# Patient Record
Sex: Male | Born: 1965 | Race: White | Hispanic: No | Marital: Married | Smoking: Never smoker
Health system: Southern US, Community
[De-identification: ages and names within clinical notes are randomized; demographics above are authoritative.]

## PROBLEM LIST (undated history)

## (undated) DIAGNOSIS — C801 Malignant (primary) neoplasm, unspecified: Secondary | ICD-10-CM

## (undated) DIAGNOSIS — C8580 Other specified types of non-Hodgkin lymphoma, unspecified site: Secondary | ICD-10-CM

## (undated) HISTORY — PX: KNEE SURGERY: SHX244

## (undated) HISTORY — PX: SHOULDER ARTHROSCOPY: SHX128

## (undated) HISTORY — PX: OTHER SURGICAL HISTORY: SHX169

## (undated) HISTORY — DX: Other specified types of non-hodgkin lymphoma, unspecified site: C85.80

---

## 2007-07-04 ENCOUNTER — Ambulatory Visit: Payer: Self-pay | Admitting: Hematology and Oncology

## 2007-07-05 LAB — COMPREHENSIVE METABOLIC PANEL
ALT: 44 U/L (ref 0–53)
Albumin: 4.4 g/dL (ref 3.5–5.2)
Alkaline Phosphatase: 78 U/L (ref 39–117)
CO2: 28 mEq/L (ref 19–32)
Glucose, Bld: 91 mg/dL (ref 70–99)
Potassium: 4.3 mEq/L (ref 3.5–5.3)
Sodium: 142 mEq/L (ref 135–145)
Total Bilirubin: 0.5 mg/dL (ref 0.3–1.2)
Total Protein: 7 g/dL (ref 6.0–8.3)

## 2007-07-05 LAB — CBC WITH DIFFERENTIAL/PLATELET
BASO%: 0.9 % (ref 0.0–2.0)
EOS%: 2.9 % (ref 0.0–7.0)
Eosinophils Absolute: 0.1 10*3/uL (ref 0.0–0.5)
MCV: 91.5 fL (ref 81.6–98.0)
MONO%: 16.8 % — ABNORMAL HIGH (ref 0.0–13.0)
NEUT#: 3.2 10*3/uL (ref 1.5–6.5)
RBC: 4.33 10*6/uL (ref 4.20–5.71)
RDW: 12.8 % (ref 11.2–14.6)
WBC: 5 10*3/uL (ref 4.0–10.0)

## 2007-07-06 ENCOUNTER — Ambulatory Visit (HOSPITAL_COMMUNITY): Admission: RE | Admit: 2007-07-06 | Discharge: 2007-07-06 | Payer: Self-pay | Admitting: Hematology and Oncology

## 2007-07-11 ENCOUNTER — Ambulatory Visit (HOSPITAL_COMMUNITY): Admission: RE | Admit: 2007-07-11 | Discharge: 2007-07-11 | Payer: Self-pay | Admitting: Hematology and Oncology

## 2007-07-11 ENCOUNTER — Encounter (INDEPENDENT_AMBULATORY_CARE_PROVIDER_SITE_OTHER): Payer: Self-pay | Admitting: Interventional Radiology

## 2007-07-16 ENCOUNTER — Other Ambulatory Visit: Admission: RE | Admit: 2007-07-16 | Discharge: 2007-07-16 | Payer: Self-pay | Admitting: Hematology and Oncology

## 2007-07-16 ENCOUNTER — Encounter: Payer: Self-pay | Admitting: Hematology and Oncology

## 2007-07-17 ENCOUNTER — Ambulatory Visit (HOSPITAL_COMMUNITY): Admission: RE | Admit: 2007-07-17 | Discharge: 2007-07-17 | Payer: Self-pay | Admitting: Hematology and Oncology

## 2007-07-19 ENCOUNTER — Ambulatory Visit (HOSPITAL_COMMUNITY): Admission: RE | Admit: 2007-07-19 | Discharge: 2007-07-19 | Payer: Self-pay | Admitting: Hematology and Oncology

## 2007-07-20 LAB — CBC WITH DIFFERENTIAL/PLATELET
BASO%: 0 % (ref 0.0–2.0)
EOS%: 0 % (ref 0.0–7.0)
HCT: 33.2 % — ABNORMAL LOW (ref 38.7–49.9)
LYMPH%: 4.9 % — ABNORMAL LOW (ref 14.0–48.0)
MCH: 30.8 pg (ref 28.0–33.4)
MCHC: 34.8 g/dL (ref 32.0–35.9)
MONO%: 2.9 % (ref 0.0–13.0)
NEUT%: 92.2 % — ABNORMAL HIGH (ref 40.0–75.0)
Platelets: 141 10*3/uL — ABNORMAL LOW (ref 145–400)
RBC: 3.75 10*6/uL — ABNORMAL LOW (ref 4.20–5.71)

## 2007-07-20 LAB — COMPREHENSIVE METABOLIC PANEL
ALT: 33 U/L (ref 0–53)
AST: 21 U/L (ref 0–37)
Alkaline Phosphatase: 61 U/L (ref 39–117)
Creatinine, Ser: 1.06 mg/dL (ref 0.40–1.50)
Sodium: 138 mEq/L (ref 135–145)
Total Bilirubin: 0.4 mg/dL (ref 0.3–1.2)

## 2007-07-20 LAB — URIC ACID: Uric Acid, Serum: 6.9 mg/dL (ref 4.0–7.8)

## 2007-07-26 LAB — CBC WITH DIFFERENTIAL/PLATELET
Basophils Absolute: 0.1 10*3/uL (ref 0.0–0.1)
EOS%: 0.9 % (ref 0.0–7.0)
HCT: 36 % — ABNORMAL LOW (ref 38.7–49.9)
HGB: 12.6 g/dL — ABNORMAL LOW (ref 13.0–17.1)
MCH: 30.7 pg (ref 28.0–33.4)
MCHC: 35 g/dL (ref 32.0–35.9)
MCV: 87.7 fL (ref 81.6–98.0)
MONO%: 3.3 % (ref 0.0–13.0)
NEUT%: 80.3 % — ABNORMAL HIGH (ref 40.0–75.0)
RDW: 14.1 % (ref 11.2–14.6)

## 2007-07-26 LAB — BASIC METABOLIC PANEL
BUN: 16 mg/dL (ref 6–23)
Creatinine, Ser: 0.8 mg/dL (ref 0.40–1.50)

## 2007-08-07 LAB — COMPREHENSIVE METABOLIC PANEL
Albumin: 4.4 g/dL (ref 3.5–5.2)
Alkaline Phosphatase: 76 U/L (ref 39–117)
CO2: 26 mEq/L (ref 19–32)
Glucose, Bld: 101 mg/dL — ABNORMAL HIGH (ref 70–99)
Potassium: 4 mEq/L (ref 3.5–5.3)
Sodium: 139 mEq/L (ref 135–145)
Total Protein: 6.3 g/dL (ref 6.0–8.3)

## 2007-08-07 LAB — CBC WITH DIFFERENTIAL/PLATELET
Eosinophils Absolute: 0.1 10*3/uL (ref 0.0–0.5)
MONO#: 0.6 10*3/uL (ref 0.1–0.9)
NEUT#: 3.4 10*3/uL (ref 1.5–6.5)
RBC: 4.17 10*6/uL — ABNORMAL LOW (ref 4.20–5.71)
RDW: 14.4 % (ref 11.2–14.6)
WBC: 4.6 10*3/uL (ref 4.0–10.0)
lymph#: 0.5 10*3/uL — ABNORMAL LOW (ref 0.9–3.3)

## 2007-08-08 ENCOUNTER — Ambulatory Visit: Payer: Self-pay | Admitting: Hematology and Oncology

## 2007-08-30 LAB — COMPREHENSIVE METABOLIC PANEL
CO2: 25 mEq/L (ref 19–32)
Creatinine, Ser: 0.9 mg/dL (ref 0.40–1.50)
Glucose, Bld: 113 mg/dL — ABNORMAL HIGH (ref 70–99)
Total Bilirubin: 0.5 mg/dL (ref 0.3–1.2)

## 2007-08-30 LAB — CBC WITH DIFFERENTIAL/PLATELET
Eosinophils Absolute: 0.1 10*3/uL (ref 0.0–0.5)
HCT: 35.6 % — ABNORMAL LOW (ref 38.7–49.9)
LYMPH%: 9.2 % — ABNORMAL LOW (ref 14.0–48.0)
MCHC: 35.2 g/dL (ref 32.0–35.9)
MCV: 92.7 fL (ref 81.6–98.0)
MONO#: 0.7 10*3/uL (ref 0.1–0.9)
MONO%: 12.2 % (ref 0.0–13.0)
NEUT#: 4.1 10*3/uL (ref 1.5–6.5)
NEUT%: 76 % — ABNORMAL HIGH (ref 40.0–75.0)
Platelets: 247 10*3/uL (ref 145–400)
WBC: 5.3 10*3/uL (ref 4.0–10.0)

## 2007-09-20 LAB — COMPREHENSIVE METABOLIC PANEL
ALT: 34 U/L (ref 0–53)
AST: 22 U/L (ref 0–37)
Albumin: 4.3 g/dL (ref 3.5–5.2)
Alkaline Phosphatase: 72 U/L (ref 39–117)
BUN: 14 mg/dL (ref 6–23)
Potassium: 4.2 mEq/L (ref 3.5–5.3)

## 2007-09-20 LAB — CBC WITH DIFFERENTIAL/PLATELET
Basophils Absolute: 0 10*3/uL (ref 0.0–0.1)
Eosinophils Absolute: 0.1 10*3/uL (ref 0.0–0.5)
HCT: 34.1 % — ABNORMAL LOW (ref 38.7–49.9)
HGB: 12 g/dL — ABNORMAL LOW (ref 13.0–17.1)
LYMPH%: 13.6 % — ABNORMAL LOW (ref 14.0–48.0)
MONO#: 0.9 10*3/uL (ref 0.1–0.9)
NEUT#: 3.6 10*3/uL (ref 1.5–6.5)
NEUT%: 67.9 % (ref 40.0–75.0)
Platelets: 292 10*3/uL (ref 145–400)
WBC: 5.4 10*3/uL (ref 4.0–10.0)

## 2007-09-26 ENCOUNTER — Ambulatory Visit (HOSPITAL_COMMUNITY): Admission: RE | Admit: 2007-09-26 | Discharge: 2007-09-26 | Payer: Self-pay | Admitting: Hematology and Oncology

## 2007-10-09 ENCOUNTER — Ambulatory Visit: Payer: Self-pay | Admitting: Hematology and Oncology

## 2007-10-11 LAB — CBC WITH DIFFERENTIAL/PLATELET
BASO%: 0.2 % (ref 0.0–2.0)
EOS%: 1.2 % (ref 0.0–7.0)
HCT: 33.3 % — ABNORMAL LOW (ref 38.7–49.9)
LYMPH%: 13.7 % — ABNORMAL LOW (ref 14.0–48.0)
MCH: 33.4 pg (ref 28.0–33.4)
MCHC: 35.6 g/dL (ref 32.0–35.9)
MCV: 93.8 fL (ref 81.6–98.0)
MONO#: 0.8 10*3/uL (ref 0.1–0.9)
MONO%: 18.6 % — ABNORMAL HIGH (ref 0.0–13.0)
NEUT%: 66.3 % (ref 40.0–75.0)
Platelets: 270 10*3/uL (ref 145–400)
RBC: 3.55 10*6/uL — ABNORMAL LOW (ref 4.20–5.71)

## 2007-10-11 LAB — COMPREHENSIVE METABOLIC PANEL
ALT: 24 U/L (ref 0–53)
Alkaline Phosphatase: 62 U/L (ref 39–117)
CO2: 26 mEq/L (ref 19–32)
Creatinine, Ser: 0.91 mg/dL (ref 0.40–1.50)
Total Bilirubin: 0.5 mg/dL (ref 0.3–1.2)

## 2007-11-01 LAB — CBC WITH DIFFERENTIAL/PLATELET
BASO%: 0.3 % (ref 0.0–2.0)
EOS%: 0.9 % (ref 0.0–7.0)
Eosinophils Absolute: 0 10*3/uL (ref 0.0–0.5)
MCHC: 34.9 g/dL (ref 32.0–35.9)
MCV: 95.6 fL (ref 81.6–98.0)
MONO%: 18.6 % — ABNORMAL HIGH (ref 0.0–13.0)
NEUT#: 2.6 10*3/uL (ref 1.5–6.5)
RBC: 3.68 10*6/uL — ABNORMAL LOW (ref 4.20–5.71)
RDW: 14.5 % (ref 11.2–14.6)

## 2007-11-01 LAB — COMPREHENSIVE METABOLIC PANEL WITH GFR
ALT: 29 U/L (ref 0–53)
AST: 23 U/L (ref 0–37)
Albumin: 4.5 g/dL (ref 3.5–5.2)
Alkaline Phosphatase: 60 U/L (ref 39–117)
BUN: 14 mg/dL (ref 6–23)
CO2: 24 meq/L (ref 19–32)
Calcium: 9.6 mg/dL (ref 8.4–10.5)
Chloride: 104 meq/L (ref 96–112)
Creatinine, Ser: 0.94 mg/dL (ref 0.40–1.50)
Glucose, Bld: 84 mg/dL (ref 70–99)
Potassium: 4.4 meq/L (ref 3.5–5.3)
Sodium: 138 meq/L (ref 135–145)
Total Bilirubin: 0.5 mg/dL (ref 0.3–1.2)
Total Protein: 6.3 g/dL (ref 6.0–8.3)

## 2007-11-22 LAB — COMPREHENSIVE METABOLIC PANEL
AST: 18 U/L (ref 0–37)
Albumin: 4.6 g/dL (ref 3.5–5.2)
Alkaline Phosphatase: 67 U/L (ref 39–117)
BUN: 18 mg/dL (ref 6–23)
Potassium: 4.3 mEq/L (ref 3.5–5.3)
Sodium: 141 mEq/L (ref 135–145)
Total Bilirubin: 0.5 mg/dL (ref 0.3–1.2)
Total Protein: 6.4 g/dL (ref 6.0–8.3)

## 2007-11-22 LAB — CBC WITH DIFFERENTIAL/PLATELET
EOS%: 0.6 % (ref 0.0–7.0)
LYMPH%: 8.4 % — ABNORMAL LOW (ref 14.0–48.0)
MCH: 33.7 pg — ABNORMAL HIGH (ref 28.0–33.4)
MCHC: 35.3 g/dL (ref 32.0–35.9)
MCV: 95.5 fL (ref 81.6–98.0)
MONO%: 12.5 % (ref 0.0–13.0)
Platelets: 271 10*3/uL (ref 145–400)
RBC: 3.69 10*6/uL — ABNORMAL LOW (ref 4.20–5.71)
RDW: 14.3 % (ref 11.2–14.6)

## 2007-11-23 ENCOUNTER — Ambulatory Visit: Payer: Self-pay | Admitting: Hematology and Oncology

## 2007-12-12 LAB — CBC WITH DIFFERENTIAL/PLATELET
Eosinophils Absolute: 0 10*3/uL (ref 0.0–0.5)
MONO#: 0.7 10*3/uL (ref 0.1–0.9)
MONO%: 17.4 % — ABNORMAL HIGH (ref 0.0–13.0)
NEUT#: 2.7 10*3/uL (ref 1.5–6.5)
RBC: 3.78 10*6/uL — ABNORMAL LOW (ref 4.20–5.71)
RDW: 14.4 % (ref 11.2–14.6)
WBC: 3.8 10*3/uL — ABNORMAL LOW (ref 4.0–10.0)

## 2007-12-12 LAB — COMPREHENSIVE METABOLIC PANEL
ALT: 21 U/L (ref 0–53)
Albumin: 4.4 g/dL (ref 3.5–5.2)
Alkaline Phosphatase: 61 U/L (ref 39–117)
CO2: 27 mEq/L (ref 19–32)
Glucose, Bld: 85 mg/dL (ref 70–99)
Potassium: 4.2 mEq/L (ref 3.5–5.3)
Sodium: 141 mEq/L (ref 135–145)
Total Protein: 6.2 g/dL (ref 6.0–8.3)

## 2008-01-01 ENCOUNTER — Ambulatory Visit (HOSPITAL_COMMUNITY): Admission: RE | Admit: 2008-01-01 | Discharge: 2008-01-01 | Payer: Self-pay | Admitting: Hematology and Oncology

## 2008-01-04 LAB — CBC WITH DIFFERENTIAL/PLATELET
Basophils Absolute: 0 10*3/uL (ref 0.0–0.1)
Eosinophils Absolute: 0.1 10*3/uL (ref 0.0–0.5)
HCT: 38.7 % (ref 38.7–49.9)
MCV: 95 fL (ref 81.6–98.0)
MONO%: 17 % — ABNORMAL HIGH (ref 0.0–13.0)
RDW: 14.5 % (ref 11.2–14.6)
WBC: 4.7 10*3/uL (ref 4.0–10.0)
lymph#: 0.6 10*3/uL — ABNORMAL LOW (ref 0.9–3.3)

## 2008-01-04 LAB — COMPREHENSIVE METABOLIC PANEL
Albumin: 4.7 g/dL (ref 3.5–5.2)
BUN: 15 mg/dL (ref 6–23)
Calcium: 9.4 mg/dL (ref 8.4–10.5)
Chloride: 105 mEq/L (ref 96–112)
Glucose, Bld: 85 mg/dL (ref 70–99)
Potassium: 4.3 mEq/L (ref 3.5–5.3)

## 2008-01-04 LAB — LACTATE DEHYDROGENASE: LDH: 181 U/L (ref 94–250)

## 2008-02-20 ENCOUNTER — Ambulatory Visit: Payer: Self-pay | Admitting: Hematology and Oncology

## 2008-02-22 ENCOUNTER — Ambulatory Visit (HOSPITAL_COMMUNITY): Admission: RE | Admit: 2008-02-22 | Discharge: 2008-02-22 | Payer: Self-pay | Admitting: Hematology and Oncology

## 2008-02-22 LAB — CBC WITH DIFFERENTIAL/PLATELET
BASO%: 0.3 % (ref 0.0–2.0)
Eosinophils Absolute: 0.1 10*3/uL (ref 0.0–0.5)
MCHC: 35.1 g/dL (ref 32.0–35.9)
MONO#: 0.5 10*3/uL (ref 0.1–0.9)
NEUT#: 2.5 10*3/uL (ref 1.5–6.5)
RBC: 4.34 10*6/uL (ref 4.20–5.71)
RDW: 12.7 % (ref 11.2–14.6)
WBC: 4.2 10*3/uL (ref 4.0–10.0)

## 2008-02-22 LAB — COMPREHENSIVE METABOLIC PANEL
ALT: 29 U/L (ref 0–53)
Albumin: 4.6 g/dL (ref 3.5–5.2)
Alkaline Phosphatase: 49 U/L (ref 39–117)
CO2: 30 mEq/L (ref 19–32)
Glucose, Bld: 93 mg/dL (ref 70–99)
Potassium: 4.4 mEq/L (ref 3.5–5.3)
Sodium: 138 mEq/L (ref 135–145)
Total Protein: 6.6 g/dL (ref 6.0–8.3)

## 2008-02-22 LAB — LACTATE DEHYDROGENASE: LDH: 153 U/L (ref 94–250)

## 2008-03-25 LAB — COMPREHENSIVE METABOLIC PANEL
Alkaline Phosphatase: 52 U/L (ref 39–117)
BUN: 10 mg/dL (ref 6–23)
Glucose, Bld: 74 mg/dL (ref 70–99)
Total Bilirubin: 0.8 mg/dL (ref 0.3–1.2)

## 2008-03-25 LAB — CBC WITH DIFFERENTIAL/PLATELET
Basophils Absolute: 0 10*3/uL (ref 0.0–0.1)
Eosinophils Absolute: 0.1 10*3/uL (ref 0.0–0.5)
HGB: 14.8 g/dL (ref 13.0–17.1)
LYMPH%: 16.5 % (ref 14.0–49.0)
MCV: 93.2 fL (ref 79.3–98.0)
MONO%: 10.7 % (ref 0.0–14.0)
NEUT#: 3.6 10*3/uL (ref 1.5–6.5)
Platelets: 179 10*3/uL (ref 140–400)
RBC: 4.56 10*6/uL (ref 4.20–5.82)

## 2008-04-01 ENCOUNTER — Ambulatory Visit: Payer: Self-pay | Admitting: Hematology and Oncology

## 2008-04-01 ENCOUNTER — Inpatient Hospital Stay (HOSPITAL_COMMUNITY): Admission: AD | Admit: 2008-04-01 | Discharge: 2008-04-03 | Payer: Self-pay | Admitting: Hematology and Oncology

## 2008-04-02 ENCOUNTER — Ambulatory Visit: Payer: Self-pay | Admitting: Hematology and Oncology

## 2008-04-15 LAB — CBC WITH DIFFERENTIAL/PLATELET
Basophils Absolute: 0 10*3/uL (ref 0.0–0.1)
Eosinophils Absolute: 0 10*3/uL (ref 0.0–0.5)
HCT: 37.5 % — ABNORMAL LOW (ref 38.4–49.9)
HGB: 12.8 g/dL — ABNORMAL LOW (ref 13.0–17.1)
LYMPH%: 13.9 % — ABNORMAL LOW (ref 14.0–49.0)
MCV: 90.6 fL (ref 79.3–98.0)
MONO%: 15.1 % — ABNORMAL HIGH (ref 0.0–14.0)
NEUT#: 3.6 10*3/uL (ref 1.5–6.5)
Platelets: 66 10*3/uL — ABNORMAL LOW (ref 140–400)

## 2008-04-15 LAB — COMPREHENSIVE METABOLIC PANEL
CO2: 28 mEq/L (ref 19–32)
Creatinine, Ser: 0.81 mg/dL (ref 0.40–1.50)
Glucose, Bld: 107 mg/dL — ABNORMAL HIGH (ref 70–99)
Sodium: 137 mEq/L (ref 135–145)
Total Bilirubin: 0.5 mg/dL (ref 0.3–1.2)
Total Protein: 6.5 g/dL (ref 6.0–8.3)

## 2008-04-21 LAB — CBC WITH DIFFERENTIAL/PLATELET
Eosinophils Absolute: 0 10*3/uL (ref 0.0–0.5)
LYMPH%: 15.3 % (ref 14.0–49.0)
MONO#: 0.3 10*3/uL (ref 0.1–0.9)
NEUT#: 3.3 10*3/uL (ref 1.5–6.5)
Platelets: 216 10*3/uL (ref 140–400)
RBC: 3.92 10*6/uL — ABNORMAL LOW (ref 4.20–5.82)
WBC: 4.3 10*3/uL (ref 4.0–10.3)
lymph#: 0.7 10*3/uL — ABNORMAL LOW (ref 0.9–3.3)

## 2008-04-21 LAB — COMPREHENSIVE METABOLIC PANEL
Albumin: 4.6 g/dL (ref 3.5–5.2)
CO2: 27 mEq/L (ref 19–32)
Calcium: 9.1 mg/dL (ref 8.4–10.5)
Chloride: 107 mEq/L (ref 96–112)
Glucose, Bld: 143 mg/dL — ABNORMAL HIGH (ref 70–99)
Potassium: 4.4 mEq/L (ref 3.5–5.3)
Sodium: 141 mEq/L (ref 135–145)
Total Protein: 6.6 g/dL (ref 6.0–8.3)

## 2008-04-22 ENCOUNTER — Inpatient Hospital Stay (HOSPITAL_COMMUNITY): Admission: AD | Admit: 2008-04-22 | Discharge: 2008-04-24 | Payer: Self-pay | Admitting: Hematology and Oncology

## 2008-05-02 LAB — CBC WITH DIFFERENTIAL/PLATELET
Basophils Absolute: 0 10*3/uL (ref 0.0–0.1)
EOS%: 0.5 % (ref 0.0–7.0)
Eosinophils Absolute: 0 10*3/uL (ref 0.0–0.5)
HGB: 11.6 g/dL — ABNORMAL LOW (ref 13.0–17.1)
LYMPH%: 7.6 % — ABNORMAL LOW (ref 14.0–49.0)
MCH: 32.6 pg (ref 27.2–33.4)
MCV: 95.3 fL (ref 79.3–98.0)
MONO%: 16.7 % — ABNORMAL HIGH (ref 0.0–14.0)
NEUT#: 4.5 10*3/uL (ref 1.5–6.5)
NEUT%: 75.1 % — ABNORMAL HIGH (ref 39.0–75.0)
Platelets: 152 10*3/uL (ref 140–400)

## 2008-05-19 ENCOUNTER — Ambulatory Visit: Payer: Self-pay | Admitting: Hematology and Oncology

## 2008-05-19 LAB — CBC WITH DIFFERENTIAL/PLATELET
BASO%: 0.3 % (ref 0.0–2.0)
EOS%: 1.8 % (ref 0.0–7.0)
Eosinophils Absolute: 0.1 10*3/uL (ref 0.0–0.5)
LYMPH%: 14.7 % (ref 14.0–49.0)
MCH: 33.4 pg (ref 27.2–33.4)
MCHC: 34.6 g/dL (ref 32.0–36.0)
MCV: 96.6 fL (ref 79.3–98.0)
MONO%: 19.7 % — ABNORMAL HIGH (ref 0.0–14.0)
Platelets: 246 10*3/uL (ref 140–400)
RBC: 3.81 10*6/uL — ABNORMAL LOW (ref 4.20–5.82)

## 2008-05-19 LAB — COMPREHENSIVE METABOLIC PANEL
AST: 26 U/L (ref 0–37)
Alkaline Phosphatase: 58 U/L (ref 39–117)
Glucose, Bld: 96 mg/dL (ref 70–99)
Sodium: 140 mEq/L (ref 135–145)
Total Bilirubin: 0.5 mg/dL (ref 0.3–1.2)
Total Protein: 6.4 g/dL (ref 6.0–8.3)

## 2008-05-20 ENCOUNTER — Ambulatory Visit: Payer: Self-pay | Admitting: Hematology and Oncology

## 2008-05-20 ENCOUNTER — Inpatient Hospital Stay (HOSPITAL_COMMUNITY): Admission: AD | Admit: 2008-05-20 | Discharge: 2008-05-22 | Payer: Self-pay | Admitting: Hematology and Oncology

## 2008-05-28 LAB — CBC WITH DIFFERENTIAL/PLATELET
BASO%: 0.2 % (ref 0.0–2.0)
Eosinophils Absolute: 0.1 10*3/uL (ref 0.0–0.5)
HCT: 34.4 % — ABNORMAL LOW (ref 38.4–49.9)
HGB: 11.9 g/dL — ABNORMAL LOW (ref 13.0–17.1)
LYMPH%: 4.1 % — ABNORMAL LOW (ref 14.0–49.0)
MCHC: 34.5 g/dL (ref 32.0–36.0)
MONO#: 0.5 10*3/uL (ref 0.1–0.9)
NEUT#: 9.7 10*3/uL — ABNORMAL HIGH (ref 1.5–6.5)
NEUT%: 89.8 % — ABNORMAL HIGH (ref 39.0–75.0)
Platelets: 98 10*3/uL — ABNORMAL LOW (ref 140–400)
WBC: 10.8 10*3/uL — ABNORMAL HIGH (ref 4.0–10.3)
lymph#: 0.4 10*3/uL — ABNORMAL LOW (ref 0.9–3.3)

## 2008-05-28 LAB — TECHNOLOGIST REVIEW

## 2008-05-30 LAB — CBC WITH DIFFERENTIAL/PLATELET
Basophils Absolute: 0.1 10*3/uL (ref 0.0–0.1)
Eosinophils Absolute: 0.1 10*3/uL (ref 0.0–0.5)
HCT: 35.3 % — ABNORMAL LOW (ref 38.4–49.9)
HGB: 12.4 g/dL — ABNORMAL LOW (ref 13.0–17.1)
MCH: 34 pg — ABNORMAL HIGH (ref 27.2–33.4)
MCV: 97.1 fL (ref 79.3–98.0)
NEUT#: 3.9 10*3/uL (ref 1.5–6.5)
NEUT%: 78.4 % — ABNORMAL HIGH (ref 39.0–75.0)
RDW: 16.6 % — ABNORMAL HIGH (ref 11.0–14.6)
lymph#: 0.5 10*3/uL — ABNORMAL LOW (ref 0.9–3.3)

## 2008-06-06 ENCOUNTER — Other Ambulatory Visit: Admission: RE | Admit: 2008-06-06 | Discharge: 2008-06-06 | Payer: Self-pay | Admitting: Oncology

## 2008-06-06 ENCOUNTER — Encounter: Payer: Self-pay | Admitting: Oncology

## 2008-07-04 ENCOUNTER — Ambulatory Visit: Payer: Self-pay | Admitting: Hematology and Oncology

## 2008-08-13 ENCOUNTER — Ambulatory Visit: Payer: Self-pay | Admitting: Hematology and Oncology

## 2009-08-11 IMAGING — CT CT CHEST W/ CM
3 of 6 series · 15 of 36 positions shown, 18 images · IV contrast (agent unspecified)
Comparison: None

CT NECK

CLINICAL DATA: Non-Hodgkins lymphoma

CT NECK AND CHEST WITH CONTRAST
TECHNIQUE: Multidetector CT imaging of the neck and chest was
performed using the standard protocol after bolus administration of
intravenous contrast.
Contrast: 100 ml Cmnipaque-3XX

[Series 2: chest routine 5.0 b40f · axial · 0.73mm/px · z∈[-518,-218]mm · 11 of 74 slices shown, 14 images]
[im 7/74  mediastinal]
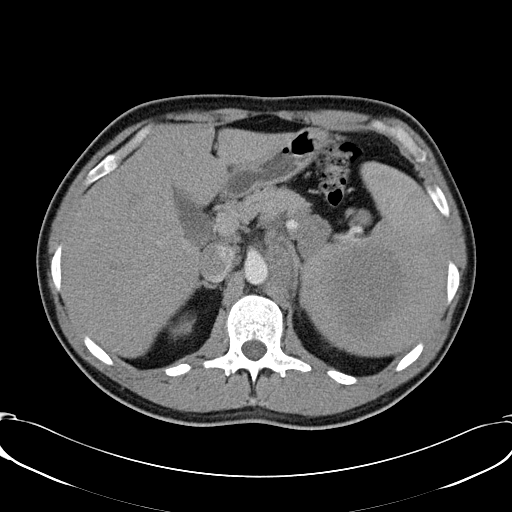
[im 7/74  lung]
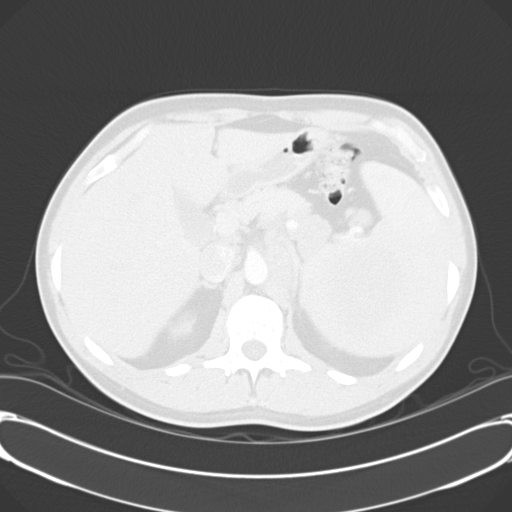
[im 13/74  lung]
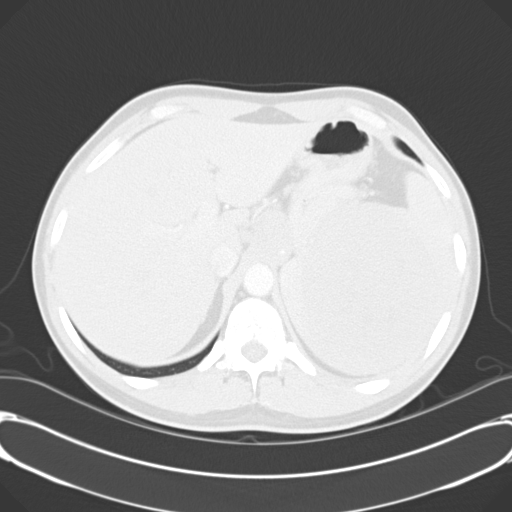
[im 19/74  lung]
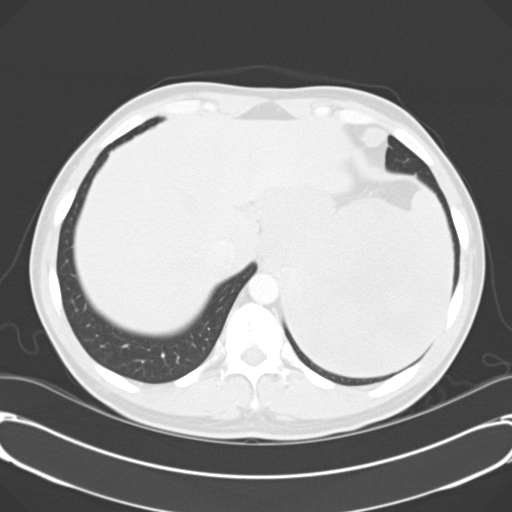
[im 25/74  lung]
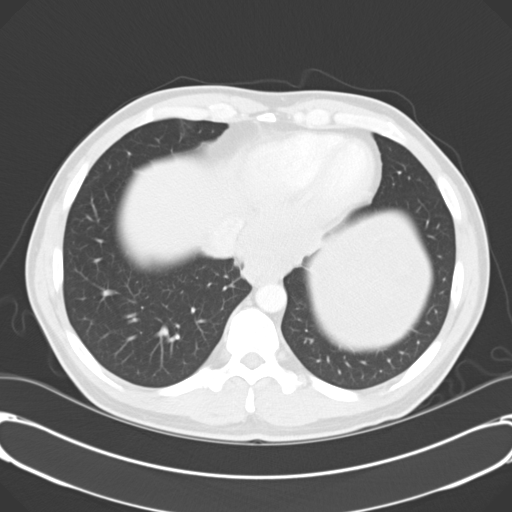
[im 31/74  mediastinal]
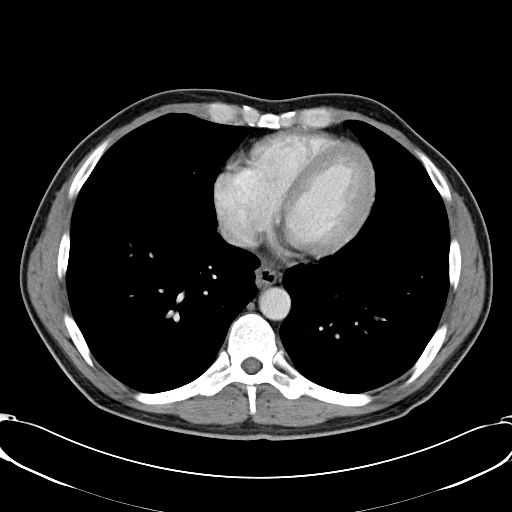
[im 31/74  lung]
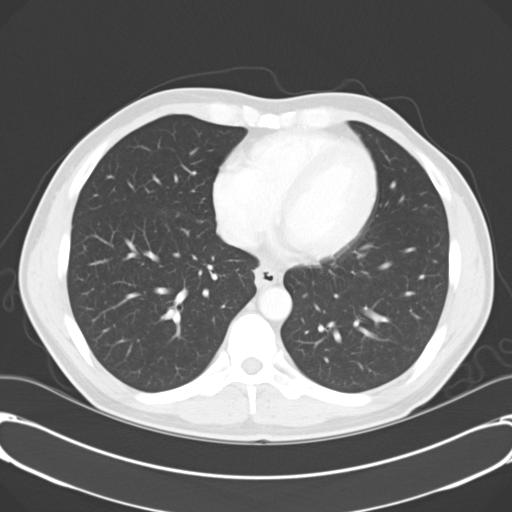
[im 37/74  lung]
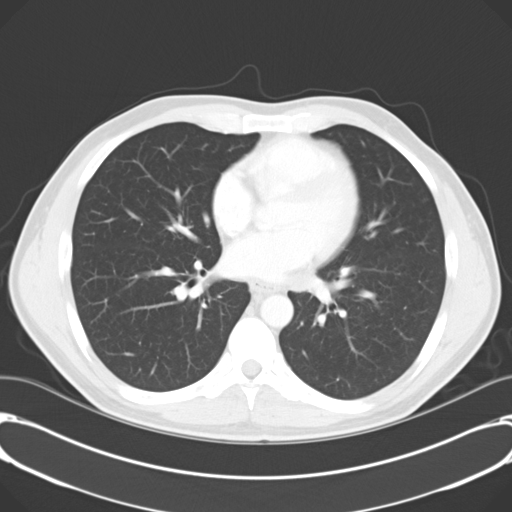
[im 43/74  lung]
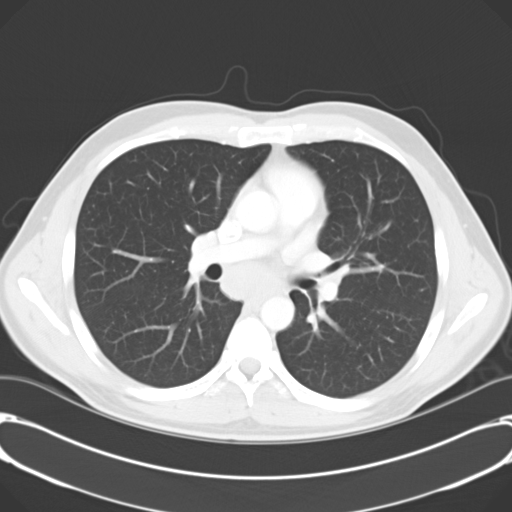
[im 49/74  lung]
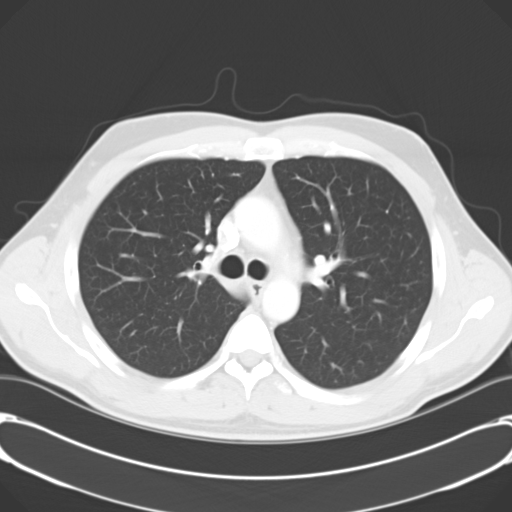
[im 55/74  mediastinal]
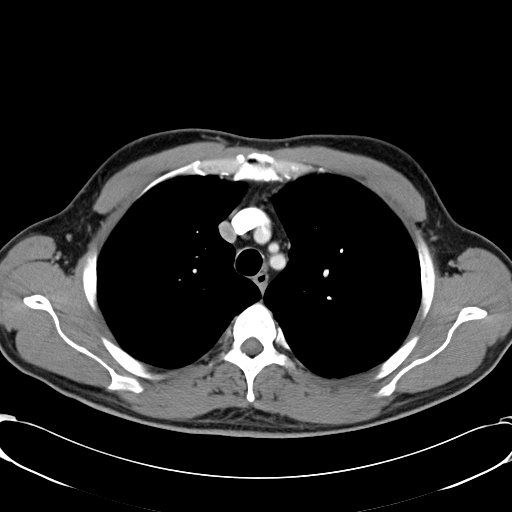
[im 55/74  lung]
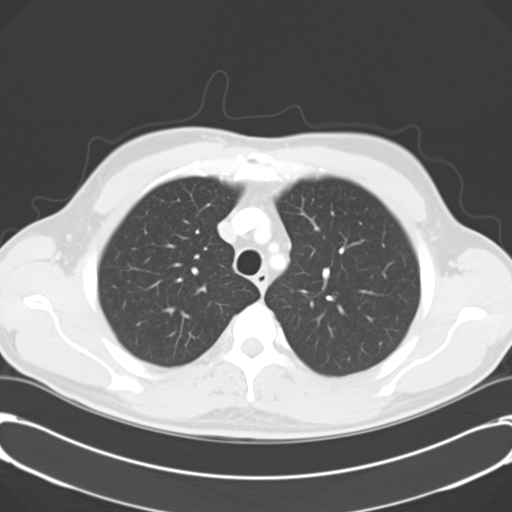
[im 61/74  lung]
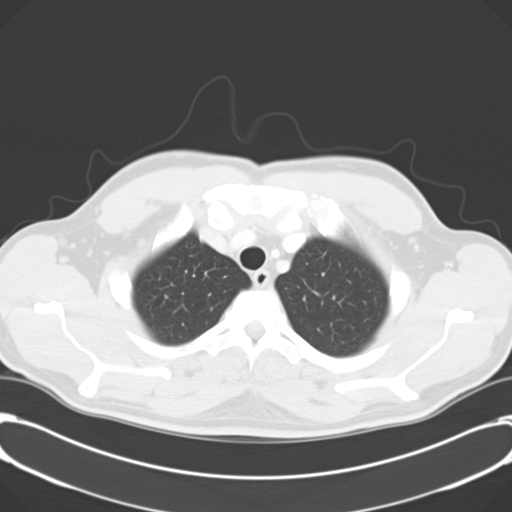
[im 67/74  lung]
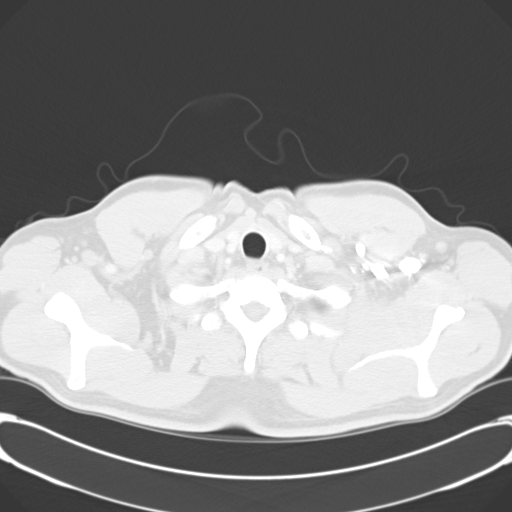

[Series 5: neck 3.0 b40s · axial · 0.46mm/px · z∈[-241,-169]mm · 3 of 84 slices shown]
[im 6/84  lung]
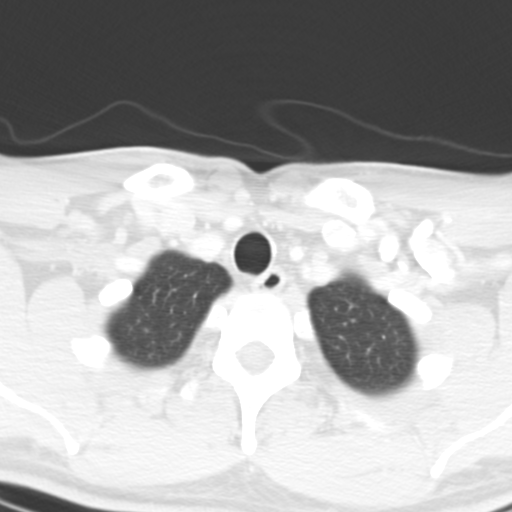
[im 18/84  lung]
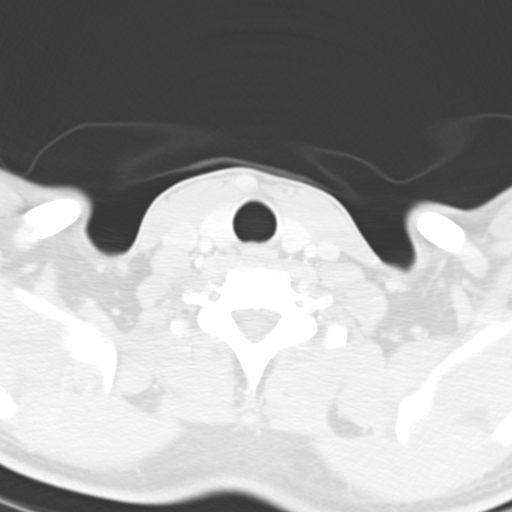
[im 30/84  lung]
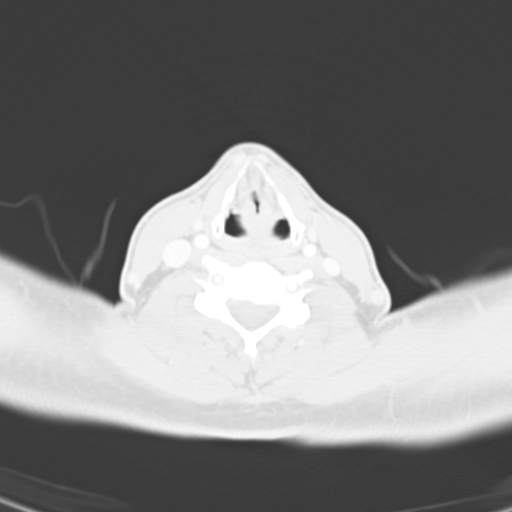

[Series 602: <mpr thick range> · coronal · 0.73mm/px · 1 of 75 slices shown]
[im 38/75  lung]
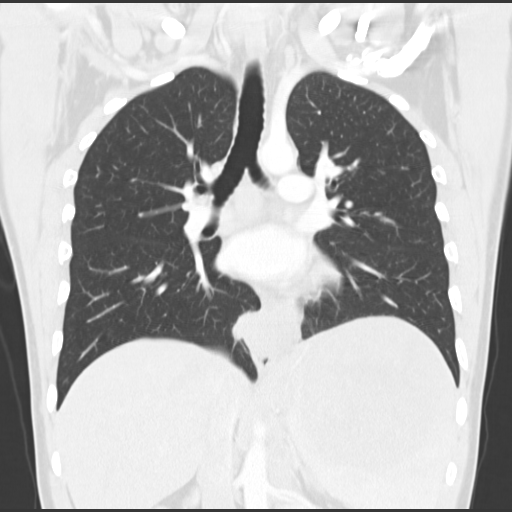

[15 of 36 positions shown; findings below may reference images not displayed]

FINDINGS: The visualized brain is unremarkable.  No intracranial
lesions and normal appearance of the major visualized vascular
structures.

The skull base, floor of the mouth and tongue base are
unremarkable.  The epiglottis, aryepiglottic folds and periglottic
fat planes are normal.  No cervical adenopathy.  The parotid and
submandibular glands appear normal.  The major vascular structures
are unremarkable.  No supraclavicular adenopathy.  The thyroid
gland appears normal.
IMPRESSION: 1.  Unremarkable CT examination of the neck.  No adenopathy.

CT CHEST
FINDINGS: The chest wall is unremarkable.  No supraclavicular or
axillary adenopathy.  There are small scattered lymph nodes.  There
is a subcarinal mass which measures 4.5 x 2.9 cm.  This is likely
adenopathy no prevascular or pretracheal adenopathy.  There is a
small lymph node in the supra mediastinum adjacent to the left
carotid artery.  No hilar adenopathy.  Esophagus is unremarkable.
There is a 2.0 x 1.4 cm node just inferior to the left inferior
pulmonary vein.  The largest mass is between the left atrium and
the esophagus near the diaphragm which may extend through the
diaphragmatic hiatus.  This measures approximately 8.5 x 5.8 cm.
Enlarged epicardial lymph nodes are noted on the left side also.

There is a large mass in the spleen measuring 12 x 10 cm.
Extensive adenopathy in the upper abdomen both in the mesentery and
retroperitoneal.

Examination of the lung parenchyma demonstrates no significant
findings.  No significant bony findings.
IMPRESSION: 1.  Extensive adenopathy in the chest and abdomen.
2.  Large splenic lesion.

## 2010-02-14 ENCOUNTER — Encounter: Payer: Self-pay | Admitting: Hematology and Oncology

## 2010-05-05 LAB — URINALYSIS, ROUTINE W REFLEX MICROSCOPIC
Bilirubin Urine: NEGATIVE
Glucose, UA: 250 mg/dL — AB
Glucose, UA: NEGATIVE mg/dL
Hgb urine dipstick: NEGATIVE
Hgb urine dipstick: NEGATIVE
Ketones, ur: >80 mg/dL — AB
Ketones, ur: NEGATIVE mg/dL
Protein, ur: NEGATIVE mg/dL
Protein, ur: NEGATIVE mg/dL
Urobilinogen, UA: 0.2 mg/dL (ref 0.0–1.0)
pH: 5.5 (ref 5.0–8.0)

## 2010-05-05 LAB — URINALYSIS, DIPSTICK ONLY
Bilirubin Urine: NEGATIVE
Glucose, UA: NEGATIVE mg/dL
Hgb urine dipstick: NEGATIVE
Ketones, ur: >80 mg/dL — AB
Protein, ur: NEGATIVE mg/dL

## 2010-05-06 LAB — COMPREHENSIVE METABOLIC PANEL
AST: 13 U/L (ref 0–37)
CO2: 24 mEq/L (ref 19–32)
Chloride: 109 mEq/L (ref 96–112)
Creatinine, Ser: 0.8 mg/dL (ref 0.4–1.5)
GFR calc Af Amer: >60 mL/min (ref >60)
GFR calc non Af Amer: >60 mL/min (ref >60)
Total Bilirubin: 0.9 mg/dL (ref 0.3–1.2)

## 2010-05-06 LAB — DIFFERENTIAL
Basophils Absolute: 0.1 10*3/uL (ref 0.0–0.1)
Basophils Relative: 1 % (ref 0–1)
Eosinophils Absolute: 0 10*3/uL (ref 0.0–0.7)
Eosinophils Relative: 0 % (ref 0–5)
Lymphocytes Relative: 5 % — ABNORMAL LOW (ref 12–46)

## 2010-05-06 LAB — URINALYSIS, DIPSTICK ONLY
Glucose, UA: 250 mg/dL — AB
Hgb urine dipstick: NEGATIVE
Specific Gravity, Urine: 1.017 (ref 1.005–1.030)
Urobilinogen, UA: 0.2 mg/dL (ref 0.0–1.0)
pH: 7 (ref 5.0–8.0)

## 2010-05-06 LAB — CBC
HCT: 39.5 % (ref 39.0–52.0)
MCV: 93.8 fL (ref 78.0–100.0)
RBC: 4.21 MIL/uL — ABNORMAL LOW (ref 4.22–5.81)
WBC: 12.4 10*3/uL — ABNORMAL HIGH (ref 4.0–10.5)

## 2010-05-10 LAB — GLUCOSE, CAPILLARY: Glucose-Capillary: 84 mg/dL (ref 70–99)

## 2010-06-08 NOTE — Discharge Summary (Signed)
NAMEGREGREY, BLOYD              ACCOUNT NO.:  [id number]   MEDICAL RECORD NO.:  [id number]          PATIENT TYPE:  INP   LOCATION:  1341                         FACILITY:  [facility]   PHYSICIAN:  Lauretta I. Odogwu, M.D.DATE OF BIRTH:  06-30-1965   DATE OF ADMISSION:  04/22/2008  DATE OF DISCHARGE:                               DISCHARGE SUMMARY   DISCHARGE DIAGNOSIS:  Large B cell non-Hodgkin's lymphoma, status post  cycle 2 of RICE chemotherapy prior to autologous transplant.   DISCHARGE PHYSICAL EXAMINATION:  VITAL SIGNS:  Pulse 68, blood pressure  115/64, temperature 97.8, respirations 18.  Saturation 98%.  Physical exam - Unremarkable.   DISCHARGE MEDICATIONS:  1. Zofran 8 mg p.o. q.12h. x2 days after chemotherapy and then p.r.n.      for nausea or vomiting.  2. Decadron 8 mg p.o. daily x3 after chemotherapy.   DISPOSITION:  Patient is discharged in stable condition.   FOLLOWUP:  Patient to follow up at the [facility] on  [place], Wednesday's and Friday's for PICC line flush.  The patient  will receive Neulasta as an outpatient on April 26, 2008.   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  The patient's history began in  June of 2009 when he presented with bulky thoracic and abdominal  lymphadenopathy with splenomegaly.  He had unintentional weight loss of  15 pounds over a 3 week period with abdominal pain.  The patient was  treated with 8 cycles of rituximab with CHOP between July 20, 2007 and  December 07, 2007.  Followup staging PET, CT scans demonstrated residual  disease within the spleen with no other evidence of disease.  The  patient had consult with [facility] and is a candidate for autologous  marrow transplant.  He was therefore admitted to [facility]  for pretreatment with RICE chemotherapy.   HOSPITAL COURSE:  The patient tolerated the therapy very well with no  complaints.  He was not experiencing any nausea or vomiting.  His PICC  line did not give him any problems.  He completes chemotherapy late this  afternoon.  Following completion, the PICC line will be de-accessed, but  left in.  The patient will be then discharged home in a stable  condition.  He is to call the office for questions or concerns.      Lauretta I. Odogwu, M.D.  Electronically Signed     LIO/MEDQ  D:  04/24/2008  T:  04/24/2008  Job:  [id number]

## 2010-06-08 NOTE — H&P (Signed)
Dwayne Wood, Dwayne Wood              ACCOUNT NO.:  [id number]   MEDICAL RECORD NO.:  [id number]          PATIENT TYPE:  INP   LOCATION:  1341                         FACILITY:  [facility]   PHYSICIAN:  Lauretta I. Odogwu, M.D.DATE OF BIRTH:  [birth year]   DATE OF ADMISSION:  04/22/2008  DATE OF DISCHARGE:                              HISTORY & PHYSICAL   PATIENT IDENTIFICATION:  The patient is a 45 year old gentleman admitted  for cycle 2 of salvage R-ICE chemotherapy for persistent PET positive  diffuse large B-cell lymphoma.   HISTORY OF PRESENT ILLNESS:  The patient had presented in June 2009 with  left-sided abdominal pain associated with early satiety and weight loss.  An ultrasound had revealed splenomegaly measuring 18.5 cm with 2  heterogeneous solid masses within the spleen, each measuring 11.3 and 2  cm.  Staging CTs on July 20, 2007, had shown retrogastric and  retroperitoneal lymph nodes as well as bulky mediastinal lymph nodes.  Biopsy of the retroperitoneal lymph nodes on July 11, 2007, had shown a  non-Hodgkin's diffuse large B cell lymphoma.  A bone marrow biopsy on  July 16, 2007, showed normal cellular bone marrow with trilineage  hematopoiesis.  The patient went on to receive 8 cycles of rituximab  with CHOP from July 20, 2007, through December 07, 2007.  The patient  received his first cycle of salvage R-ICE chemotherapy on April 01, 2008,  which he tolerated well.  He presents for cycle number 2.  He has no  current complaints at this time.  He denies fever, chills, and night  sweats.  His weight is stable.   MEDICATIONS:  None.   ALLERGIES:  ALLOPURINOL.   SOCIAL HISTORY:  The patient is married and has a 73 -year-old daughter.  Denies history of alcohol or tobacco use.  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   REVIEW OF SYSTEMS:  A 15-point review of systems essentially negative.   PHYSICAL  EXAMINATION:  GENERAL:  The patient is a well-appearing, well-  nourished man in no current distress.  VITAL SIGNS:  Pulse 85, blood pressure 119/81, temperature 97.8,  respirations 18, weight 204 pounds.  HEENT:  Head is atraumatic and normocephalic.  Sclerae are anicteric.  Mouth moist.  No thrush.  CHEST:  Clear to percussion and auscultation.  CARDIOVASCULAR:  Reveals first and second heart sounds present.  No  added sounds or murmurs.  ABDOMEN:  Soft, nontender.  Bowel sounds present.  EXTREMITIES:  No edema.  PICC in place in the right upper extremity  without signs of infection.   Laboratory data obtained April 21, 2008, was white cell count 4.3,  hemoglobin 12.9, hematocrit 36.5, platelets 216.  Sodium 141, potassium  4.4, chloride 107, CO2 of 27, BUN 13, creatinine 0.9, glucose 143.  Total bilirubin 0.3, alkaline phosphatase 72, AST 23, ALT 38, calcium  9.1.   IMPRESSION/PLAN:  Mr. 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  The patient  presents for cycle  2 of salvage R-ICE chemotherapy prior an autologous  transplant at [facility].  Following treatment, the patient will  receive Neulasta as an outpatient on April 26, 2008.   The patient is a full code.      Lauretta I. Odogwu, M.D.  Electronically Signed     LIO/MEDQ  D:  04/22/2008  T:  04/22/2008  Job:  [id number]

## 2010-06-08 NOTE — Discharge Summary (Signed)
Dwayne Wood, Dwayne Wood              ACCOUNT NO.:  [id number]   MEDICAL RECORD NO.:  [id number]          PATIENT TYPE:  INP   LOCATION:  1338                         FACILITY:  [facility]   PHYSICIAN:  Lauretta I. Odogwu, M.D.DATE OF BIRTH:  [birth year]   DATE OF ADMISSION:  05/20/2008  DATE OF DISCHARGE:                               DISCHARGE SUMMARY   DISCHARGE DIAGNOSES:  Persistent large B-cell non-Hodgkin's lymphoma  status post 3 cycles of RICE chemotherapy prior to autologous  transplant.   DISCHARGE MEDICATIONS:  1. Zofran 8 mg p.o. q.12 for 3 days after chemotherapy and then p.r.n.      for nausea and vomiting.  2. Decadron 8 mg p.o. daily for 3 days after chemotherapy.   DISPOSITION:  The patient is discharged in stable condition.   FOLLOW UP:  The patient is to follow up at the [facility]  next Monday, Wednesday, and Friday for CBC.  he also follows up at [facility]  per schedule.   HISTORY OF PRESENT ILLNESS:  The patient is a 45 year old gentleman with  history of persistent large B-cell lymphoma in his spleen admitted for  cycle #2 of RICE chemotherapy prior to autologous transplant.  The  patient initially presented in June 2009 with bulky thoracic and  abdominal lymphadenopathy with splenomegaly.  This was associated with  weight loss of 15 pounds over 3-week period with upper abdominal pain.  He went on to receive 8 cycles of rituximab and CHOP from July 20, 2007  through December 07, 2007.  A follow-up scan had demonstrated residual  disease from the spleen.  He had consulted at [facility], and was felt to be a  candidate for autologous marrow transplant.  Previously admitted to  [facility] for therapy.   HOSPITAL COURSE:  The patient throughout the treatment tolerated his  treatment very well with very minimal complaints.  Had 1 episode of  nausea which was managed with Compazine.  His PICC line was functioning  well.  He is afebrile. He denies mucositis and  diaarhea. He tolerated  chemotherapy with minimal complaints. Following completion the PICC line  will be de-accessed and removed.  The patient will be discharged in  stable condition.  He is to calll for fevers greaer than 101F. He has a  schedule follow-up visits at the [facility] and at [facility].      Lauretta I. Odogwu, M.D.  Electronically Signed     LIO/MEDQ  D:  05/22/2008  T:  05/22/2008  Job:  [id number]

## 2010-06-08 NOTE — Discharge Summary (Signed)
[name], [name]              ACCOUNT NO.:  [id number]   MEDICAL RECORD NO.:  [id number]          PATIENT TYPE:  INP   LOCATION:  1307                         FACILITY:  [facility]   PHYSICIAN:  Lauretta I. Odogwu, M.D.DATE OF BIRTH:  [birth year]   DATE OF ADMISSION:  04/01/2008  DATE OF DISCHARGE:  04/03/2008                               DISCHARGE SUMMARY   DISCHARGE DIAGNOSES:  1. Large B-cell non-Hodgkin's lymphoma status post cycle one of RICE      chemotherapy.  2. Intermittent nausea controlled with antiemetics.  3. Full code.   DISCHARGE LABORATORY DATA:  CBC with differential reveals white blood  count of 12.4, hemoglobin 13.7, hematocrit 39.5, platelets of 166, ANC  10.9, MCV 93.8.  Chemistries reveal a sodium of 139, potassium 3.8,  chloride 109, BUN of 9, creatinine 0.80, glucose of 136, bilirubin 0.9,  alkaline phosphatase 44, AST 13, ALT 18, total protein 5.7, albumin 3.9,  calcium of 8.9.   DISCHARGE PHYSICAL EXAMINATION:  VITAL SIGNS:  Temperature 97.5, heart  rate 62, respirations 20, blood pressure 113/71, O2 saturation 95% on  room air.  GENERAL:  This is a well-developed, well-nourished white male in no  acute distress.  HEENT:  Sclerae are nonicteric.  There is no thrush or mucositis.  SKIN:  Without rashes or lesions.  LYMPH:  No peripheral lymphadenopathy.  CARDIAC:  Regular rate and rhythm without murmurs or gallops.  Peripheral pulses are 2+ bilaterally.  Right upper extremity PICC line  without signs and symptoms of infection.  CHEST:  Lungs clear to auscultation.  ABDOMEN:  Positive bowel sounds, soft, nontender, nondistended.  No  organomegaly.  EXTREMITIES:  No edema or cyanosis.  NEUROLOGIC:  Alert and oriented x3.  Strength, sensation and  coordination all grossly intact.   DISCHARGE MEDICATIONS:  1. Zofran 8 mg p.o. q.12h. x3 days after chemotherapy and then p.r.n.      nausea and vomiting.  2. Decadron one p.o. daily x3 days after  chemotherapy.   DISCHARGE DISPOSITION:  The patient is discharged in stable condition.   FOLLOW UP:  The patient will follow up at the [facility] on  Mondays, Wednesdays and Fridays for PICC line flush.  This will begin on  Friday, April 04, 2008.  He will also follow up for daily injection  appointments, day 5-12 of chemotherapy, which will be April 05, 2008  through April 11, 2008.  The patient will then have a follow-up  appointment with Dr. Dalene Carrow on April 15, 2008, at which time we will  reassess CBC with differential and CMET.  The patient is a given an  appointment calendar.   PATIENT IDENTIFICATION/HISTORY OF PRESENT ILLNESS:  [name]  is a 45 year old white male with persistent large B-cell non-Hodgkin's  lymphoma in his spleen, admitted for cycle one of salvage RICE  chemotherapy.  The patient's oncology history began in June of 2009 when  he presented for evaluation of ongoing abdominal pain, specifically in  the left upper quadrant, associated with early satiety, as well as a 20-  pound unintentional weight loss over a 3-week  period.  An abdominal  ultrasound was obtained on June 29, 2007 which revealed a large spleen  measuring 18.5 cm with two heterogeneous solid masses.  Follow-up CT  scan on June 29, 2007 revealed a large mass in the spleen measuring 10 x  11 x 10 cm.  There were low density lesions in the splenic hilum  measuring 2.5 to 3 cm consistent with lymph nodes.  Follow up needle  biopsy of retroperitoneal and periaortic lymph nodes on July 11, 2007.  Pathology positive for non-Hodgkin's B-cell lymphoma.  Bone marrow  aspirate and biopsy on July 16, 2007 revealed normal cellular bone  marrow.  The patient then had a PET scan on July 17, 2007 which revealed  intense hypermetabolic nodal activity within the subcarinal mediastinum,  precordial nodes and diaphragmatic hiatus and retroperitoneum consistent  with lymphoma.  There was enlarged  hypermetabolic mass within the spleen  consistent with lymphomatous involvement, intermuscular foci within the  left posterior chest wall consistent with lymphoma metastasis.  The  patient went on to receive chemotherapy with R-CHOP x8 cycles between  July 20, 2007 and December 07, 2007.  He then had a PET scan on January 01, 2008 which revealed interval reduction in size of the spleen and  geographic splenic lesion which suggests positive response to therapy.  Follow-up CT of the chest, abdomen and pelvis and PET scan on February 22, 2008 revealed stable small lymph nodes in the chest, further  decrease in size of the geographical areas of low density in the spleen.  Normal sized spleen unchanged.  Stable small area of low density in the  right lobe of the liver.  Stable small bilateral probable renal cyst.  In the pelvis, mild sigmoid diverticulosis with no adenopathy.  The PET  scan revealed persistent slightly increased hyper metabolism within the  spleen.  This is consistent with residual lymphoma.  There was no  evidence of extra splenic disease.  The patient was then evaluated by  Dr. Suella Grove at [facility], and it was decided that  he is a candidate for autologous bone marrow transplant.  He was  therefore admitted to [facility] for pretreatment with RICE  chemotherapy.   HOSPITAL COURSE:  In preparation for chemotherapy, the patient had a  right upper extremity PICC line placed with ultrasound and fluoro  guidance.  The patient began IV hydration and chemotherapy with RICE  chemotherapy.  He did experience some mild nausea while receiving  chemotherapy.  This was managed with p.r.n. antiemetics.  He had no  problems with vomiting.  He continued to have normal energy level.  No  fevers, chills or night sweats.  He did not experience any shortness of  breath or cough.  He had normal appetite and had no problems with  constipation or diarrhea.  He had  no swelling of extremities.  The  patient did not experience any pain or tenderness at his PICC line site.  On the day of discharge, the patient is evaluated and is not  experiencing any pain or problems other than intermittent mild nausea.  He will continue with day three of his chemotherapy, and we will plan  for discharge from the hospital after he completes this treatment.  He  will have his PICC line flushed prior to discharge, but will keep the  PICC line in place and will return to the outpatient cancer center for  PICC flushes and daily Neupogen injections as per  appointment calendar.  He will keep his follow-up appointment outpatient with Dr. Dalene Carrow on  April 15, 2008.  He is advised to call prior to follow-up appointment if  he has any questions or problems,  particularly if he notes fever greater than or equal to 101 or any  redness or swelling or pain at his PICC line site, and he verbalized  understanding.  The patient will be evaluated by Dr. Dalene Carrow prior to  discharge.   Plan of care was formulated in consultation with Dr. Dalene Carrow.      Sherilyn Banker, NP      Lauretta I. Odogwu, M.D.  Electronically Signed    RJ/MEDQ  D:  04/03/2008  T:  04/03/2008  Job:  [id number]   cc:   Jonita Albee, M.D.  Fax: [phone]   Suella Grove, M.D.  [facility]

## 2010-06-08 NOTE — H&P (Signed)
NAMESONG, GARRIS              ACCOUNT NO.:  [id number]   MEDICAL RECORD NO.:  [id number]          PATIENT TYPE:  INP   LOCATION:                               FACILITY:  [facility]   PHYSICIAN:  Lauretta I. Odogwu, M.D.DATE OF BIRTH:  02-Jun-1965   DATE OF ADMISSION:  05/20/2008  DATE OF DISCHARGE:  05/22/2008                              HISTORY & PHYSICAL   PATIENT IDENTIFICATION/HISTORY OF PRESENT ILLNESS:  [name]  is a 45 year old white male with persistent PET positive B-cell non-  Hodgkin's lymphoma of the spleen admitted for cycle 3 of salvage RICE  chemotherapy.  The patient's oncology history began in June of 2009 when  he presented for evaluation of left sided abdominal pain associated with  early satiety and weight loss.  Follow up ultrasound revealed  splenomegaly measuring 18.5 cm with 2 heterogenous solid masses within  the spleen.  Followup CT scan on June 28, 2008 revealed a large spleen  mass and low density lesions in the splenic hilum consistent with lymph  nodes.  Followup needle biopsy of the retroperitoneal lymph node on July 10, 2008 was positive for diffuse large B-cell non-Hodgkin's lymphoma.  Bone marrow biopsy July 16, 2007 showed normal cellular bone marrow with  trilineage hematopoiesis.  The patient went on to receive 8 cycles of  chemotherapy with CHOP, Rituxan on July 20, 2007 and December 07, 2007.  Followup PET scan in January, 2010 reveals slightly increased  hypermetabolism in the spleen consistent with residual lymphoma.  The  patient was subsequently evaluated by Dr. Elroy Channel at [facility] and was deemed a candidate for autologous transplant.  He has now  received 2 cycles of salvage RICE chemotherapy, cycle 1 between April 01, 2008 and April 03, 2008 and cycle 2 between April 22, 2008 and April 24, 2008, which he tolerated with some fatigue and grade 1 nausea, otherwise  no toxicities.  He presents today for admission for  cycle 3 of salvage  RICE chemotherapy.   PAST MEDICAL HISTORY:  History of surgery on his hand, shoulder and  knees.   ALLERGIES:  ALLOPURINOL.   MEDICATIONS:  None.   FAMILY HISTORY:  Positive for prostate cancer in his father.   SOCIAL HISTORY:  The patient is married. He has a 30 year old daughter.  No history of alcohol or tobacco use.  He works in a Web designer for a  Pharmacist, community.   REVIEW OF SYSTEMS:  Positive for generalized fatigue but no difficulty  completing activities of daily living.  No fevers, chills or night  sweats.  No shortness of breath or cough.  No significant anorexia.  No  nausea, vomiting, diarrhea or constipation.  No rectal bleeding.  No  dysuria, no frequency or hematuria.  No alteration in sensation or  balance.   PHYSICAL EXAMINATION:  VITAL SIGNS:  Temperature 98.1, heart rate 81,  respirations 20, blood pressure 123/76.  Weight 205.5 pounds.  BSA 2.16  MSQ.  GENERAL:  This is a well-developed, well-nourished white male in no  acute distress.  HEENT:  Alopecia.  Sclerae nonicteric.  There is no oral thrush or  mucositis.  SKIN:  No rashes or lesions.  LYMPH NODES:  No cervical, supraclavicular, axillary or inguinal  lymphadenopathy.  CARDIAC:  Regular rate and rhythm without murmurs or gallops.  Peripheral pulses are 2+.  EXTREMITIES:  ESA left upper extremity PICC line without signs or  symptoms of infection.  CHEST:  Lungs clear to auscultation.  He also has a right chest Hickman  catheter without signs or symptoms of infection.  ABDOMEN:  Positive bowel sounds, soft, nontender, nondistended.  No  organomegaly.  EXTREMITIES:  No edema or cyanosis.  NEUROLOGICAL:  Alert and oriented x3.  Strength, sensation and  coordination all grossly intact.   LABORATORY DATA:  Laboratory data from May 19, 2008 shows CBC with  differential revealing white blood cell count of 3.9, hemoglobin 12.7,  hematocrit 36.8, platelet count of 246,000, ANC of  2.5, MCV of 96.6.  Chemistries reveal a sodium of 140, potassium 4.2, chloride 105, BUN of  16, creatinine 0.90, glucose of 96, bilirubin 0.5, alkaline phosphatase  58, AST 26, ALT of 50, total protein 6.4, albumin 4.5, calcium of 9.4.   IMPRESSION AND PLANS:  1. Dwayne Wood is a 45 year old white male with persistent large B-      cell non-Hodgkin's lymphoma of the spleen admitted today for cycle      of 3 of salvage RICE chemotherapy prior to autologous transplant at      [facility].  2. Patient is a full code.   COMMENT:  The patient is obtaining consultation with Dr. Thalia Party  and plan of care, as well as chemotherapy orders are formulated with Dr.  Dalene Carrow.      Sherilyn Banker, NP      Lauretta I. Odogwu, M.D.  Electronically Signed    RJ/MEDQ  D:  05/20/2008  T:  05/20/2008  Job:  [id number]   cc:   Jonita Albee, M.D.  Fax: [phone]   [facility] Dr. Suella Grove

## 2010-06-08 NOTE — H&P (Signed)
Dwayne Wood, Dwayne Wood              ACCOUNT NO.:  [id number]   MEDICAL RECORD NO.:  [id number]          PATIENT TYPE:  INP   LOCATION:  1307                         FACILITY:  [facility]   PHYSICIAN:  Lauretta I. 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   HISTORY OF PRESENT ILLNESS:  In June 2009, the patient presented with  left side abdominal pain associated with early saity with weight loss.  An ultrasound on June 29, 2007, had shown splenomegaly with spleen  measuring 18.5 cm along with two heterogenous solid masses within the  spleen, each measuring 11.3 cm and 2 cm.  Staging CT scans July 19, 2007, showed retrogastric and retroperitoneal lymph nodes as well as  bulky mediastinal lymphadenopathy.  A needle biopsy of the  retroperitoneal lymph nodes on July 11, 2007, revealed a non-Hodgkin's  diffuse large B-cell lymphoma. A bone marrow biopsy in July 16, 2007,  showed a normocellular bone marrow with trilineage hematopoiesis.  There  was no evidence of lymphoma. The patient went on to receive eight cycles  of rituximab with CHOP from July 20, 2007, through December 07, 2007.  Post treatment PET had shown persistent hypermetabolic activity within  the spleen.  The patient has consulted with the bone marrow transplant  team at [facility] and is a candidate for autologous bone marrow  transplant.  The patient presents to receives cycle 1 of R-ICE prior to  the bone marrow transplantation.   MEDICATIONS:  None.   ALLERGIES:  Allopurinol.   SOCIAL HISTORY:  The patient is married with 74 year old daughter.  He  denies history of alcohol or tobacco use.  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   FAMILY HISTORY:  The patient's father had prostate cancer.   REVIEW OF SYSTEMS:  A 15-point review of systems essentially negative.   PHYSICAL EXAMINATION:  GENERAL:  The patient is well-appearing, well-  nourished in no distress.  VITALS:  Pulse 76, blood pressure 136/76, temperature 97.1, respirations  18, weight 197.7 pounds.  HEENT:  Head is atraumatic and normocephalic.  Extraocular muscles  intact.  Sclerae is anicteric.  Pupils equal and reactive to light.  Mouth moist without ulcerations, thrush or lesions.  NECK:  Supple.  Trachea central.  CHEST:  Notes good entry bilaterally, clear to both percussion and  auscultation.  CARDIOVASCULAR:  First and second heart sounds present without signs of  murmurs.  ABDOMEN:  Soft, nontender.  There is no hepatosplenomegaly.  Bowel  sounds are present.  LYMPH NODES:  No palpable cervical, axillary, inguinal lymphadenopathy.  EXTREMITIES:  Reveal no edema.  Pulses present and symmetrical.   LABORATORY DATA:  White cell count 5.1, hemoglobin 14.8, hematocrit  42.5, platelets 139.  Sodium 140, potassium 2.9, chloride 105, CO2 30,  BUN 10 creatinine 0.87, glucose 74,  T bili 0.8, alkaline phosphatase 52,  AST 18, ALT 23.   IMPRESSION AND PLAN:  A 45 year old man with persistent PET positive  diffuse large B cell non-Hodgkin's lymphoma of the spleen without any  other evidence of disease following eight cycles of R-CHOP.  The patient  is to be admitted to begin salvage RICE x2 cycles prior to autologous  transplant at [facility].  The patient will receive Neupogen dosed  at 480 mcg daily from day 5-12 as an outpatient.  Patient is a full  code.      Lauretta I. Odogwu, M.D.  Electronically Signed     LIO/MEDQ  D:  04/01/2008  T:  04/01/2008  Job:  [id number]

## 2010-07-22 ENCOUNTER — Other Ambulatory Visit: Payer: Self-pay | Admitting: Hematology & Oncology

## 2010-07-22 ENCOUNTER — Ambulatory Visit (HOSPITAL_BASED_OUTPATIENT_CLINIC_OR_DEPARTMENT_OTHER): Payer: Managed Care, Other (non HMO) | Admitting: Hematology & Oncology

## 2010-07-22 DIAGNOSIS — Z452 Encounter for adjustment and management of vascular access device: Secondary | ICD-10-CM

## 2010-07-22 DIAGNOSIS — R109 Unspecified abdominal pain: Secondary | ICD-10-CM

## 2010-07-22 DIAGNOSIS — C8589 Other specified types of non-Hodgkin lymphoma, extranodal and solid organ sites: Secondary | ICD-10-CM

## 2010-07-22 LAB — CBC WITH DIFFERENTIAL (CANCER CENTER ONLY)
BASO%: 0.5 % (ref 0.0–2.0)
EOS%: 4.1 % (ref 0.0–7.0)
HGB: 14.2 g/dL (ref 13.0–17.1)
LYMPH#: 2.6 10*3/uL (ref 0.9–3.3)
MCHC: 36.2 g/dL — ABNORMAL HIGH (ref 32.0–35.9)
NEUT#: 1.8 10*3/uL (ref 1.5–6.5)
Platelets: 296 10*3/uL (ref 145–400)
RDW: 14.2 % (ref 11.1–15.7)

## 2010-07-26 ENCOUNTER — Other Ambulatory Visit: Payer: Self-pay | Admitting: Hematology & Oncology

## 2010-07-26 DIAGNOSIS — R634 Abnormal weight loss: Secondary | ICD-10-CM

## 2010-07-26 DIAGNOSIS — R509 Fever, unspecified: Secondary | ICD-10-CM

## 2010-07-26 DIAGNOSIS — C8590 Non-Hodgkin lymphoma, unspecified, unspecified site: Secondary | ICD-10-CM

## 2010-07-26 LAB — COMPREHENSIVE METABOLIC PANEL
AST: 25 U/L (ref 0–37)
Alkaline Phosphatase: 66 U/L (ref 39–117)
BUN: 13 mg/dL (ref 6–23)
Calcium: 9.3 mg/dL (ref 8.4–10.5)
Creatinine, Ser: 1.07 mg/dL (ref 0.50–1.35)
Glucose, Bld: 86 mg/dL (ref 70–99)

## 2010-07-26 LAB — PROTEIN ELECTROPHORESIS, SERUM
Albumin ELP: 67.9 % — ABNORMAL HIGH (ref 55.8–66.1)
Alpha-1-Globulin: 4.6 % (ref 2.9–4.9)
Alpha-2-Globulin: 7.6 % (ref 7.1–11.8)
Beta 2: 5.4 % (ref 3.2–6.5)
Beta Globulin: 6.4 % (ref 4.7–7.2)
Total Protein, Serum Electrophoresis: 6.3 g/dL (ref 6.0–8.3)

## 2010-07-26 LAB — RETICULOCYTES (CHCC)
RBC.: 3.99 MIL/uL — ABNORMAL LOW (ref 4.22–5.81)
Retic Ct Pct: 2.7 % — ABNORMAL HIGH (ref 0.4–2.3)

## 2010-07-27 ENCOUNTER — Encounter (HOSPITAL_BASED_OUTPATIENT_CLINIC_OR_DEPARTMENT_OTHER): Payer: Managed Care, Other (non HMO) | Admitting: Hematology & Oncology

## 2010-07-27 DIAGNOSIS — C8589 Other specified types of non-Hodgkin lymphoma, extranodal and solid organ sites: Secondary | ICD-10-CM

## 2010-07-27 DIAGNOSIS — Z23 Encounter for immunization: Secondary | ICD-10-CM

## 2010-08-04 ENCOUNTER — Encounter (HOSPITAL_COMMUNITY): Payer: Self-pay

## 2010-08-04 ENCOUNTER — Encounter (HOSPITAL_COMMUNITY)
Admission: RE | Admit: 2010-08-04 | Discharge: 2010-08-04 | Disposition: A | Discharge status: Home / Self Care | Payer: Managed Care, Other (non HMO) | Source: Ambulatory Visit | Attending: Hematology & Oncology | Admitting: Hematology & Oncology

## 2010-08-04 DIAGNOSIS — C8590 Non-Hodgkin lymphoma, unspecified, unspecified site: Secondary | ICD-10-CM

## 2010-08-04 DIAGNOSIS — R509 Fever, unspecified: Secondary | ICD-10-CM

## 2010-08-04 DIAGNOSIS — C8589 Other specified types of non-Hodgkin lymphoma, extranodal and solid organ sites: Secondary | ICD-10-CM | POA: Insufficient documentation

## 2010-08-04 DIAGNOSIS — R634 Abnormal weight loss: Secondary | ICD-10-CM

## 2010-08-04 HISTORY — DX: Malignant (primary) neoplasm, unspecified: C80.1

## 2010-08-04 LAB — GLUCOSE, CAPILLARY: Glucose-Capillary: 93 mg/dL (ref 70–99)

## 2010-08-04 MED ORDER — FLUDEOXYGLUCOSE F - 18 (FDG) INJECTION
17.1000 | Freq: Once | INTRAVENOUS | Status: AC | PRN
  Administered 2010-08-04: 17.1 via INTRAVENOUS

## 2010-10-21 LAB — CBC
HCT: 38.7 — ABNORMAL LOW
HCT: 39.7
MCHC: 34.1
MCV: 91
Platelets: 116 — ABNORMAL LOW
Platelets: 129 — ABNORMAL LOW
RDW: 14
WBC: 3.9 — ABNORMAL LOW

## 2010-10-21 LAB — DIFFERENTIAL
Basophils Absolute: 0
Eosinophils Absolute: 0.1
Eosinophils Relative: 3
Lymphocytes Relative: 16
Neutrophils Relative %: 62

## 2010-10-21 LAB — CHROMOSOME ANALYSIS, BONE MARROW

## 2010-10-21 LAB — BONE MARROW EXAM

## 2010-12-30 ENCOUNTER — Other Ambulatory Visit: Payer: Managed Care, Other (non HMO) | Admitting: Lab

## 2011-01-06 ENCOUNTER — Telehealth: Payer: Self-pay | Admitting: Hematology & Oncology

## 2011-01-06 ENCOUNTER — Other Ambulatory Visit: Payer: Managed Care, Other (non HMO) | Admitting: Lab

## 2011-01-06 ENCOUNTER — Ambulatory Visit: Payer: Managed Care, Other (non HMO) | Admitting: Hematology & Oncology

## 2011-01-06 NOTE — Telephone Encounter (Signed)
Pt called and cx 12/13 apt due to being sick.  He resch for 12/19, nurse was notified

## 2011-01-12 ENCOUNTER — Ambulatory Visit (HOSPITAL_BASED_OUTPATIENT_CLINIC_OR_DEPARTMENT_OTHER): Payer: Managed Care, Other (non HMO) | Admitting: Hematology & Oncology

## 2011-01-12 ENCOUNTER — Other Ambulatory Visit: Payer: Self-pay | Admitting: Hematology & Oncology

## 2011-01-12 ENCOUNTER — Other Ambulatory Visit (HOSPITAL_BASED_OUTPATIENT_CLINIC_OR_DEPARTMENT_OTHER): Payer: Managed Care, Other (non HMO) | Admitting: Lab

## 2011-01-12 ENCOUNTER — Encounter: Payer: Self-pay | Admitting: Hematology & Oncology

## 2011-01-12 VITALS — BP 125/78 | HR 75 | Temp 97.5°F | Ht 74.0 in | Wt 218.0 lb

## 2011-01-12 DIAGNOSIS — C8589 Other specified types of non-Hodgkin lymphoma, extranodal and solid organ sites: Secondary | ICD-10-CM

## 2011-01-12 DIAGNOSIS — C8580 Other specified types of non-Hodgkin lymphoma, unspecified site: Secondary | ICD-10-CM

## 2011-01-12 HISTORY — DX: Other specified types of non-hodgkin lymphoma, unspecified site: C85.80

## 2011-01-12 LAB — COMPREHENSIVE METABOLIC PANEL
ALT: 24 U/L (ref 0–53)
Albumin: 4.6 g/dL (ref 3.5–5.2)
CO2: 26 mEq/L (ref 19–32)
Chloride: 105 mEq/L (ref 96–112)
Glucose, Bld: 83 mg/dL (ref 70–99)
Potassium: 4.3 mEq/L (ref 3.5–5.3)
Sodium: 142 mEq/L (ref 135–145)
Total Protein: 6.7 g/dL (ref 6.0–8.3)

## 2011-01-12 LAB — CBC WITH DIFFERENTIAL (CANCER CENTER ONLY)
HCT: 42 % (ref 38.7–49.9)
MCHC: 35.2 g/dL (ref 32.0–35.9)
MCV: 99 fL — ABNORMAL HIGH (ref 82–98)
RDW: 14.4 % (ref 11.1–15.7)

## 2011-01-12 LAB — CHCC SATELLITE - SMEAR

## 2011-01-12 LAB — LACTATE DEHYDROGENASE: LDH: 176 U/L (ref 94–250)

## 2011-01-12 NOTE — Progress Notes (Signed)
This office note has been dictated.

## 2011-01-12 NOTE — Progress Notes (Signed)
CC:   Stan Head. Cleta Alberts, M.D. Suella Grove, MD  DIAGNOSIS:  Diffuse large cell non-Hodgkin lymphoma in clinical remission status post stem cell transplant.  INTERIM HISTORY:  Mr. Schiller comes in followup.  He is doing great.  We last saw him back in June.  We see him every 6 months.  We did go ahead and do a PET scan on him.  The PET scan was done in July.  The PET scan did not show any evidence of recurrent lymphoma.  He has had no problems with nausea or vomiting.  There has been no abdominal pain.  He has had no fever, sweats, or chills.  There has been no rash.  There is no change in bowel or bladder habits.  He is still working without any difficulties.  PHYSICAL EXAM:  General:  This is a well-developed, well-nourished, white gentleman in no obvious distress.  Vital Signs: 07/28/1990.  Pulse is 75, heart rate 18, blood pressure 125/78.  Weight is 218.  Head and Neck Exam:  Normocephalic, atraumatic skull.  There are no ocular or oral lesions.  There are no palpable cervical or supraclavicular lymph nodes.  Lungs:  Clear to percussion and auscultation bilaterally. Cardiac Exam:  Regular rhythm with normal S1, S2.  There are no murmurs, rubs, or bruits.  Abdominal Exam:  Soft with good bowel sounds.  There is no palpable abdominal mass.  There is no fluid wave.  He has a laparotomy and laparoscopy scars.  He has no palpable thyromegaly.  He is status post splenectomy.  Extremities: No clubbing, cyanosis, or edema.  He has good range motion of the joints.  Neurological Exam:  No focal neurological deficits.  LABORATORY STUDIES:  White cell count is 7.4, hemoglobin 14.8, hematocrit 42, platelet count is 329.  IMPRESSION:  Dwayne Wood is a 45 year old white gentleman with a history of diffuse large cell non-Hodgkin lymphoma.  He ultimately underwent stem cell transplantation at [facility].  He had his transplant back (I think) in 2010.  He also had his spleen removed back  in September 2010.  I do not see that we need to put him through any scans right now.  We will go ahead and follow him up in 6 more months.    ______________________________ Josph Macho, M.D. PRE/MEDQ  D:  01/12/2011  T:  01/12/2011  Job:  [id number]

## 2011-01-13 LAB — VITAMIN D 25 HYDROXY (VIT D DEFICIENCY, FRACTURES): Vit D, 25-Hydroxy: 36 ng/mL (ref 30–89)

## 2011-01-13 LAB — COMPREHENSIVE METABOLIC PANEL
ALT: 24 U/L (ref 0–53)
Alkaline Phosphatase: 67 U/L (ref 39–117)
CO2: 26 mEq/L (ref 19–32)
Creatinine, Ser: 0.92 mg/dL (ref 0.50–1.35)
Sodium: 142 mEq/L (ref 135–145)
Total Bilirubin: 0.4 mg/dL (ref 0.3–1.2)
Total Protein: 6.7 g/dL (ref 6.0–8.3)

## 2011-01-13 LAB — LACTATE DEHYDROGENASE: LDH: 176 U/L (ref 94–250)

## 2011-07-01 ENCOUNTER — Ambulatory Visit (HOSPITAL_BASED_OUTPATIENT_CLINIC_OR_DEPARTMENT_OTHER): Payer: Managed Care, Other (non HMO) | Admitting: Hematology & Oncology

## 2011-07-01 ENCOUNTER — Other Ambulatory Visit (HOSPITAL_BASED_OUTPATIENT_CLINIC_OR_DEPARTMENT_OTHER): Payer: Managed Care, Other (non HMO) | Admitting: Lab

## 2011-07-01 DIAGNOSIS — C8589 Other specified types of non-Hodgkin lymphoma, extranodal and solid organ sites: Secondary | ICD-10-CM

## 2011-07-01 DIAGNOSIS — C8580 Other specified types of non-Hodgkin lymphoma, unspecified site: Secondary | ICD-10-CM

## 2011-07-01 LAB — CBC WITH DIFFERENTIAL (CANCER CENTER ONLY)
BASO#: 0 10*3/uL (ref 0.0–0.2)
Eosinophils Absolute: 0.1 10*3/uL (ref 0.0–0.5)
HCT: 39.4 % (ref 38.7–49.9)
HGB: 14.1 g/dL (ref 13.0–17.1)
LYMPH#: 2.8 10*3/uL (ref 0.9–3.3)
MCH: 34.9 pg — ABNORMAL HIGH (ref 28.0–33.4)
MCHC: 35.8 g/dL (ref 32.0–35.9)
NEUT#: 2.1 10*3/uL (ref 1.5–6.5)
NEUT%: 35.9 % — ABNORMAL LOW (ref 40.0–80.0)
RBC: 4.04 10*6/uL — ABNORMAL LOW (ref 4.20–5.70)

## 2011-07-01 LAB — LACTATE DEHYDROGENASE: LDH: 161 U/L (ref 94–250)

## 2011-07-01 LAB — COMPREHENSIVE METABOLIC PANEL
Albumin: 4.5 g/dL (ref 3.5–5.2)
BUN: 18 mg/dL (ref 6–23)
Calcium: 9.4 mg/dL (ref 8.4–10.5)
Chloride: 105 mEq/L (ref 96–112)
Creatinine, Ser: 1.04 mg/dL (ref 0.50–1.35)
Glucose, Bld: 93 mg/dL (ref 70–99)
Potassium: 4.4 mEq/L (ref 3.5–5.3)

## 2011-07-01 NOTE — Progress Notes (Signed)
CC:   Stan Head. Cleta Alberts, M.D. Suella Grove, MD  DIAGNOSIS:  Diffuse large-cell non-Hodgkin lymphoma, clinical remission.  CURRENT THERAPY:  Observation.  INTERIM HISTORY:  [name] comes in for followup.  He is doing okay. We last saw him 6 months ago.  Since then, he is working without any difficulties.  He is enjoying summer.  He has been out to the pool.  He has been pretty active overall.  He has had no problems fever-wise.  He has had no cough or shortness of breath.  He has had no nausea or vomiting.  There have been no rashes. He has had no headache.  He has had no change in bowel or bladder habits.  PHYSICAL EXAMINATION:  This is a well-developed, well-nourished white gentleman in no obvious distress.  Vital signs:  97.5, pulse 61, respiratory rate 20, blood pressure 120/73.  Weight is 213.  Head and neck:  A normocephalic, atraumatic skull.  There are no ocular or oral lesions.  There are no palpable cervical or supraclavicular lymph nodes. Lungs:  Clear bilaterally.  Cardiac:  Regular rate and rhythm with a normal S1 and S2.  There are no murmurs, rubs or bruits.  Abdomen:  Soft with good bowel sounds.  There is no palpable abdominal mass.  He has a well-healed laparotomy scar from his splenectomy.  He has no palpable hepatomegaly.  Extremities:  No clubbing, cyanosis or edema. Neurologic:  No focal neurological deficits.  LABORATORY STUDIES:  White cell count is 5.9, hemoglobin 14.1, hematocrit 39.4, platelet count 289.  IMPRESSION:  [name] is a 46 year old gentleman with history of large cell non-Hodgkin lymphoma.  He ultimately underwent transplantation at [facility].  He had this back in 2010.  I do not see any evidence of recurrent disease.  I do not see that we need to put him through any studies right now.  We will plan to get him back in 6 months' time.    ______________________________ Josph Macho, M.D. PRE/MEDQ  D:  07/01/2011  T:   07/01/2011  Job:  [id number]

## 2011-07-01 NOTE — Progress Notes (Signed)
This office note has been dictated.

## 2011-07-05 ENCOUNTER — Telehealth: Payer: Self-pay | Admitting: *Deleted

## 2011-07-05 NOTE — Telephone Encounter (Signed)
Message copied by Anselm Jungling on Tue Jul 05, 2011  9:43 AM ------      Message from: Josph Macho      Created: Sat Jul 02, 2011 12:28 PM       Please call and tell him that his labs look fantastic. Cindee Lame

## 2011-07-05 NOTE — Telephone Encounter (Signed)
Called patient to let him know that his labwork was fantastic per dr. Myna Hidalgo

## 2012-01-09 ENCOUNTER — Other Ambulatory Visit (HOSPITAL_BASED_OUTPATIENT_CLINIC_OR_DEPARTMENT_OTHER): Payer: Managed Care, Other (non HMO) | Admitting: Lab

## 2012-01-09 ENCOUNTER — Ambulatory Visit (HOSPITAL_BASED_OUTPATIENT_CLINIC_OR_DEPARTMENT_OTHER): Payer: Managed Care, Other (non HMO) | Admitting: Hematology & Oncology

## 2012-01-09 DIAGNOSIS — C8580 Other specified types of non-Hodgkin lymphoma, unspecified site: Secondary | ICD-10-CM

## 2012-01-09 DIAGNOSIS — C8589 Other specified types of non-Hodgkin lymphoma, extranodal and solid organ sites: Secondary | ICD-10-CM

## 2012-01-09 DIAGNOSIS — M818 Other osteoporosis without current pathological fracture: Secondary | ICD-10-CM

## 2012-01-09 LAB — CBC WITH DIFFERENTIAL (CANCER CENTER ONLY)
Eosinophils Absolute: 0.2 10*3/uL (ref 0.0–0.5)
HGB: 14.7 g/dL (ref 13.0–17.1)
LYMPH#: 2.6 10*3/uL (ref 0.9–3.3)
MCH: 34.8 pg — ABNORMAL HIGH (ref 28.0–33.4)
MONO#: 1 10*3/uL — ABNORMAL HIGH (ref 0.1–0.9)
MONO%: 15.6 % — ABNORMAL HIGH (ref 0.0–13.0)
NEUT#: 2.5 10*3/uL (ref 1.5–6.5)
Platelets: 311 10*3/uL (ref 145–400)
RBC: 4.22 10*6/uL (ref 4.20–5.70)
WBC: 6.3 10*3/uL (ref 4.0–10.0)

## 2012-01-09 NOTE — Progress Notes (Signed)
This office note has been dictated.

## 2012-01-09 NOTE — Progress Notes (Signed)
CC:   Stan Head. Cleta Alberts, M.D. Suella Grove, MD  DIAGNOSIS:  History of diffuse large-cell non-Hodgkin lymphoma-clinical remission post transplant.  CURRENT THERAPY:  Observation.  INTERIM HISTORY:  Mr. Velis comes in for his followup.  He is doing great.  He had quite a busy summer and fall.  His mother, unfortunately, fell and broke both ankles.  She is in a rehab center right now. Hopefully, she will be able to get home soon.  He also I think had a friend who passed away.  He is off now for the rest of the year.  He works for [employer].  He has had no abdominal pain.  He has had no cough or shortness of breath.  He has had no change in bowel or bladder habits.  He has had no rashes.  He has had no fever, sweats, or chills.  His last PET scan was done back a year and a half ago.  Everything looked fine with no evidence of recurrent disease.  PHYSICAL EXAMINATION:  General:  This is a well-developed, well- nourished white gentleman in no obvious distress.  Vital Signs:  98.2, pulse 76, respiratory rate 16, blood pressure 113/70.  Weight is 220. Head and Neck:  Normocephalic, atraumatic skull.  There are no ocular or oral lesions.  There are no palpable cervical or supraclavicular lymph nodes.  Lungs:  Clear bilaterally.  Cardiac:  Regular rate and rhythm with a normal S1 and S2.  There are no murmurs, rubs, or bruits. Abdomen:  Soft with good bowel sounds.  There is no palpable abdominal mass.  He has a laparotomy scar from his splenectomy.  There is no palpable hepatomegaly.  Back:  No tenderness over the spine, ribs, or hips.  Extremities:  No clubbing, cyanosis, or edema.  Neurological:  No focal neurological deficits.  LABORATORY STUDIES:  White cell count is 6.3, hemoglobin 14.7, hematocrit 41.5, platelet count is 311.  IMPRESSION:  Mr. Bromell is a 46 year old gentleman with history of diffuse large-cell lymphoma.  He ultimately  underwent stem cell transplant at [facility].  He underwent this back in 2010.  Everything still looks good.  I do not see need for any need for any scans on him.  New guidelines from the NCCN committee do not recommend routine PET scanning for lymphoma.  We will get him back in 6 more months.  He certainly can come back to see [place] sooner if any problems do arise.    ______________________________ Josph Macho, M.D. PRE/MEDQ  D:  01/09/2012  T:  01/09/2012  Job:  [id number]

## 2012-01-10 LAB — COMPREHENSIVE METABOLIC PANEL
Albumin: 4.6 g/dL (ref 3.5–5.2)
Alkaline Phosphatase: 62 U/L (ref 39–117)
Calcium: 9.8 mg/dL (ref 8.4–10.5)
Chloride: 105 mEq/L (ref 96–112)
Glucose, Bld: 85 mg/dL (ref 70–99)
Potassium: 4.3 mEq/L (ref 3.5–5.3)
Sodium: 142 mEq/L (ref 135–145)
Total Protein: 6.3 g/dL (ref 6.0–8.3)

## 2012-01-10 LAB — VITAMIN D 25 HYDROXY (VIT D DEFICIENCY, FRACTURES): Vit D, 25-Hydroxy: 46 ng/mL (ref 30–89)

## 2012-01-12 ENCOUNTER — Telehealth: Payer: Self-pay | Admitting: *Deleted

## 2012-01-12 NOTE — Telephone Encounter (Signed)
Message copied by Anselm Jungling on Thu Jan 12, 2012 12:43 PM ------      Message from: Josph Macho      Created: Tue Jan 10, 2012  9:10 PM       Call - labs are great!!  Merry Christmas!!!  Cindee Lame

## 2012-01-12 NOTE — Telephone Encounter (Signed)
Called patient to let him know that his labs were all great per d.r ennever.

## 2012-05-15 ENCOUNTER — Telehealth: Payer: Self-pay | Admitting: Hematology & Oncology

## 2012-05-15 NOTE — Telephone Encounter (Signed)
Changed time of 07-10-12 mailed new schedule

## 2012-07-09 ENCOUNTER — Telehealth: Payer: Self-pay | Admitting: Hematology & Oncology

## 2012-07-09 NOTE — Telephone Encounter (Signed)
Patient called and cx 07/10/12 and resch for 07/31/12

## 2012-07-10 ENCOUNTER — Ambulatory Visit: Payer: Managed Care, Other (non HMO) | Admitting: Hematology & Oncology

## 2012-07-10 ENCOUNTER — Other Ambulatory Visit: Payer: Managed Care, Other (non HMO) | Admitting: Lab

## 2012-07-31 ENCOUNTER — Other Ambulatory Visit (HOSPITAL_BASED_OUTPATIENT_CLINIC_OR_DEPARTMENT_OTHER): Payer: Managed Care, Other (non HMO) | Admitting: Lab

## 2012-07-31 ENCOUNTER — Ambulatory Visit (HOSPITAL_BASED_OUTPATIENT_CLINIC_OR_DEPARTMENT_OTHER): Payer: Managed Care, Other (non HMO) | Admitting: Hematology & Oncology

## 2012-07-31 VITALS — BP 115/71 | HR 65 | Temp 98.2°F | Resp 18 | Ht 74.0 in | Wt 216.0 lb

## 2012-07-31 DIAGNOSIS — M818 Other osteoporosis without current pathological fracture: Secondary | ICD-10-CM

## 2012-07-31 DIAGNOSIS — C8589 Other specified types of non-Hodgkin lymphoma, extranodal and solid organ sites: Secondary | ICD-10-CM

## 2012-07-31 DIAGNOSIS — C8580 Other specified types of non-Hodgkin lymphoma, unspecified site: Secondary | ICD-10-CM

## 2012-07-31 LAB — CBC WITH DIFFERENTIAL (CANCER CENTER ONLY)
BASO%: 0.5 % (ref 0.0–2.0)
EOS%: 2.2 % (ref 0.0–7.0)
HCT: 41.6 % (ref 38.7–49.9)
LYMPH#: 2.4 10*3/uL (ref 0.9–3.3)
LYMPH%: 41.2 % (ref 14.0–48.0)
MCHC: 34.9 g/dL (ref 32.0–35.9)
MCV: 100 fL — ABNORMAL HIGH (ref 82–98)
NEUT%: 40.1 % (ref 40.0–80.0)
Platelets: 293 10*3/uL (ref 145–400)
RDW: 13.9 % (ref 11.1–15.7)

## 2012-07-31 NOTE — Progress Notes (Signed)
This office note has been dictated.

## 2012-08-01 ENCOUNTER — Telehealth: Payer: Self-pay | Admitting: Hematology & Oncology

## 2012-08-01 LAB — COMPREHENSIVE METABOLIC PANEL
ALT: 27 U/L (ref 0–53)
AST: 19 U/L (ref 0–37)
Creatinine, Ser: 1.13 mg/dL (ref 0.50–1.35)
Total Bilirubin: 0.5 mg/dL (ref 0.3–1.2)

## 2012-08-01 NOTE — Telephone Encounter (Signed)
Mailed January schedule °

## 2012-08-01 NOTE — Progress Notes (Signed)
CC:   Dwayne Grove, MD  DIAGNOSIS:  Recurrent large-cell non-Hodgkin lymphoma, status post stem cell transplant.  CURRENT THERAPY:  Observation.  INTERIM HISTORY:  Dwayne Wood comes in for his followup.  As always, I see him walking his dog in the neighborhood.  He is doing well.  His mother is doing better.  She is out of the rehab center.  He has had no problems with fatigue or weakness.  He said he is working full time.  He has had no nausea or vomiting.  There has been no change in bowel or bladder habits.  He has had no skin rashes.  There has been no headache.  There has been no fever, sweats or chills.  Overall, his performance status is ECOG 0.  PHYSICAL EXAMINATION:  General:  This is a well-developed, well- nourished white gentleman in no obvious distress.  Vital signs: Temperature of 98.2, pulse 65, respiratory rate 18, blood pressure 115/71.  Weight is 216.  Head and neck:  Normocephalic, atraumatic skull.  There are no ocular or oral lesions.  There are no palpable cervical or supraclavicular lymph nodes.  Lungs:  Clear bilaterally. Cardiac:  Regular rate and rhythm with a normal S1 and S2.  There are no murmurs, rubs or bruits.  Abdomen:  Soft with good bowel sounds.  There is no palpable abdominal mass.  He has well-healed laparoscopy scars from his splenectomy.  Extremities:  Show no clubbing, cyanosis or edema.  Skin:  No rashes, ecchymosis, or petechia.  Back:  No tenderness over the spine, ribs, or hips.  Neurological:  Shows no focal neurological deficits.  LABORATORY STUDIES:  White cell count is 5.4, hemoglobin 14.5, hematocrit 41.6, platelet count 293.  IMPRESSION:  Dwayne Wood is a 47 year old gentleman with history of diffuse large cell non-Hodgkin lymphoma.  He ultimately underwent stem cell transplantation at [facility].  This was back in 2010.  I do not see any clinical evidence of recurrent disease.  We will continue to follow him  along every 6 months.  I do not see a need for any scans on him.    ______________________________ Dwayne Wood, M.D. PRE/MEDQ  D:  07/31/2012  T:  08/01/2012  Job:  [id number]

## 2012-08-03 ENCOUNTER — Telehealth: Payer: Self-pay | Admitting: Oncology

## 2012-08-03 ENCOUNTER — Telehealth: Payer: Self-pay | Admitting: Hematology & Oncology

## 2012-08-03 NOTE — Telephone Encounter (Addendum)
Message copied by Lacie Draft on Fri Aug 03, 2012  4:58 PM ------      Message from: Arlan Organ R      Created: Thu Aug 02, 2012  6:33 PM       Call - labs look good!!  Pete ------Left message on machine.

## 2012-08-03 NOTE — Telephone Encounter (Addendum)
Message copied by Cathi Roan on Fri Aug 03, 2012  1:23 PM ------      Message from: Arlan Organ R      Created: Thu Aug 02, 2012  6:33 PM       Call - labs look good!!  Cindee Lame ------ 08-03-12  Called and left message on patients home phone, regarding above  MD note. Lupita Raider LPN

## 2013-02-04 ENCOUNTER — Other Ambulatory Visit (HOSPITAL_BASED_OUTPATIENT_CLINIC_OR_DEPARTMENT_OTHER): Payer: Managed Care, Other (non HMO) | Admitting: Lab

## 2013-02-04 ENCOUNTER — Ambulatory Visit (HOSPITAL_BASED_OUTPATIENT_CLINIC_OR_DEPARTMENT_OTHER): Payer: Managed Care, Other (non HMO) | Admitting: Hematology & Oncology

## 2013-02-04 VITALS — BP 119/76 | HR 72 | Temp 98.0°F | Resp 18 | Ht 74.0 in | Wt 216.0 lb

## 2013-02-04 DIAGNOSIS — C8580 Other specified types of non-Hodgkin lymphoma, unspecified site: Secondary | ICD-10-CM

## 2013-02-04 DIAGNOSIS — C8589 Other specified types of non-Hodgkin lymphoma, extranodal and solid organ sites: Secondary | ICD-10-CM

## 2013-02-04 LAB — CBC WITH DIFFERENTIAL (CANCER CENTER ONLY)
BASO#: 0 10*3/uL (ref 0.0–0.2)
BASO%: 0.5 % (ref 0.0–2.0)
EOS ABS: 0.2 10*3/uL (ref 0.0–0.5)
EOS%: 2.3 % (ref 0.0–7.0)
HEMATOCRIT: 43.1 % (ref 38.7–49.9)
HGB: 14.8 g/dL (ref 13.0–17.1)
LYMPH#: 2.6 10*3/uL (ref 0.9–3.3)
LYMPH%: 39.6 % (ref 14.0–48.0)
MCH: 34 pg — AB (ref 28.0–33.4)
MCHC: 34.3 g/dL (ref 32.0–35.9)
MCV: 99 fL — AB (ref 82–98)
MONO#: 0.9 10*3/uL (ref 0.1–0.9)
MONO%: 13.6 % — AB (ref 0.0–13.0)
NEUT#: 2.9 10*3/uL (ref 1.5–6.5)
NEUT%: 44 % (ref 40.0–80.0)
PLATELETS: 326 10*3/uL (ref 145–400)
RBC: 4.35 10*6/uL (ref 4.20–5.70)
RDW: 14.5 % (ref 11.1–15.7)
WBC: 6.6 10*3/uL (ref 4.0–10.0)

## 2013-02-04 MED ORDER — CEFDINIR 300 MG PO CAPS: 300.0000 mg | ORAL_CAPSULE | Freq: Two times a day (BID) | ORAL | Status: DC

## 2013-02-04 NOTE — Progress Notes (Signed)
This office note has been dictated.

## 2013-02-05 LAB — COMPREHENSIVE METABOLIC PANEL
ALK PHOS: 56 U/L (ref 39–117)
ALT: 38 U/L (ref 0–53)
AST: 15 U/L (ref 0–37)
Albumin: 4.6 g/dL (ref 3.5–5.2)
BILIRUBIN TOTAL: 0.6 mg/dL (ref 0.3–1.2)
BUN: 19 mg/dL (ref 6–23)
CO2: 27 mEq/L (ref 19–32)
CREATININE: 1.15 mg/dL (ref 0.50–1.35)
Calcium: 9.6 mg/dL (ref 8.4–10.5)
Chloride: 102 mEq/L (ref 96–112)
Glucose, Bld: 99 mg/dL (ref 70–99)
Potassium: 4.1 mEq/L (ref 3.5–5.3)
SODIUM: 140 meq/L (ref 135–145)
TOTAL PROTEIN: 6.7 g/dL (ref 6.0–8.3)

## 2013-02-05 LAB — KAPPA/LAMBDA LIGHT CHAINS
KAPPA FREE LGHT CHN: 1.42 mg/dL (ref 0.33–1.94)
Kappa:Lambda Ratio: 1.31 (ref 0.26–1.65)
Lambda Free Lght Chn: 1.08 mg/dL (ref 0.57–2.63)

## 2013-02-05 NOTE — Progress Notes (Signed)
DIAGNOSIS:  Recurrent large cell non-Hodgkin lymphoma.  CURRENT THERAPY:  Observation.  INTERIM HISTORY:  [name] comes in for his followup.  As always, I see him walking his dog in my neighborhood.  I just saw him 2 days ago.  He has been doing great.  He is still working.  He is having no problems with cough or shortness of breath.  There is no fatigue or weakness.  He had 1 episode of fever about a month or so ago.  This was self-limited.  He has had no rashes.  He has had no leg swelling.  He has had no headache.  PHYSICAL EXAMINATION:  General:  This is a well-developed, well- nourished, white gentleman in no obvious distress.  Vital Signs: Temperature of 98.1, pulse 72, respiratory rate 18, blood pressure 119/76, weight is 216 pounds.  Head and Neck:  Normocephalic, atraumatic skull.  There are no ocular or oral lesions.  There are no palpable cervical or supraclavicular lymph nodes.  Lungs:  Clear bilaterally. Cardiac:  Regular rate and rhythm with normal S1, S2.  There are no murmurs, rubs, or bruits.  Abdomen:  Soft.  He has good bowel sounds. There is no fluid wave.  There is no palpable abdominal mass.  His spleen is removed.  His splenectomy scar is well healed.  There is no palpable hepatomegaly.  Extremities:  No clubbing, cyanosis, or edema. Neurological:  No focal neurological deficits.  LABORATORY DATA STUDIES:  White cell count is 6.6, hemoglobin 14.8, hematocrit 43.1, platelet count 326.  IMPRESSION:  [name] is really nice 48 year old gentleman.  He had history of recurrent large cell lymphoma.  We ultimately underwent autologous stem cell transplant at [facility].  This was back in 2010.  So far, I have not seen no evidence of recurrent disease.  He does take vitamin D.  I am going to empirically give him Omnicef (600 mg p.o. daily x7 days) to take if he does have a fever over 100.5.  I think this would certainly be helpful for him.  For now, I  will plan to get him back in another 6 months.  I do not see need for any scans on him.   ______________________________ Volanda Napoleon, M.D. PRE/MEDQ  D:  02/04/2013  T:  02/05/2013  Job:  [id number]

## 2013-02-06 ENCOUNTER — Telehealth: Payer: Self-pay | Admitting: *Deleted

## 2013-02-06 NOTE — Telephone Encounter (Signed)
Called patient to let him know that his labwork was all excellent per dr.Ennever.

## 2013-02-06 NOTE — Telephone Encounter (Signed)
Message copied by Rico Ala on Wed Feb 06, 2013  1:46 PM ------      Message from: Burney Gauze R      Created: Mon Feb 04, 2013  6:56 PM       Call - labs are excellent!!  Laurey Arrow ------

## 2013-07-04 ENCOUNTER — Telehealth: Payer: Self-pay | Admitting: Hematology & Oncology

## 2013-07-04 NOTE — Telephone Encounter (Signed)
Left pt message moved 7-13 to 7-20

## 2013-08-05 ENCOUNTER — Ambulatory Visit: Payer: Managed Care, Other (non HMO) | Admitting: Hematology & Oncology

## 2013-08-05 ENCOUNTER — Other Ambulatory Visit: Payer: Managed Care, Other (non HMO) | Admitting: Lab

## 2013-08-12 ENCOUNTER — Ambulatory Visit (HOSPITAL_BASED_OUTPATIENT_CLINIC_OR_DEPARTMENT_OTHER): Payer: Managed Care, Other (non HMO) | Admitting: Hematology & Oncology

## 2013-08-12 ENCOUNTER — Other Ambulatory Visit (HOSPITAL_BASED_OUTPATIENT_CLINIC_OR_DEPARTMENT_OTHER): Payer: Managed Care, Other (non HMO) | Admitting: Lab

## 2013-08-12 ENCOUNTER — Encounter: Payer: Self-pay | Admitting: Hematology & Oncology

## 2013-08-12 VITALS — BP 110/60 | HR 77 | Temp 98.1°F | Resp 18 | Ht 74.0 in | Wt 219.0 lb

## 2013-08-12 DIAGNOSIS — Z87898 Personal history of other specified conditions: Secondary | ICD-10-CM

## 2013-08-12 DIAGNOSIS — C8580 Other specified types of non-Hodgkin lymphoma, unspecified site: Secondary | ICD-10-CM

## 2013-08-12 DIAGNOSIS — E559 Vitamin D deficiency, unspecified: Secondary | ICD-10-CM

## 2013-08-12 LAB — CBC WITH DIFFERENTIAL (CANCER CENTER ONLY)
BASO#: 0 10*3/uL (ref 0.0–0.2)
BASO%: 0.3 % (ref 0.0–2.0)
EOS%: 2.3 % (ref 0.0–7.0)
Eosinophils Absolute: 0.1 10*3/uL (ref 0.0–0.5)
HCT: 41.6 % (ref 38.7–49.9)
HGB: 14.9 g/dL (ref 13.0–17.1)
LYMPH#: 3.1 10*3/uL (ref 0.9–3.3)
LYMPH%: 51.3 % — AB (ref 14.0–48.0)
MCH: 35 pg — AB (ref 28.0–33.4)
MCHC: 35.8 g/dL (ref 32.0–35.9)
MCV: 98 fL (ref 82–98)
MONO#: 0.7 10*3/uL (ref 0.1–0.9)
MONO%: 12 % (ref 0.0–13.0)
NEUT#: 2 10*3/uL (ref 1.5–6.5)
NEUT%: 34.1 % — ABNORMAL LOW (ref 40.0–80.0)
PLATELETS: 319 10*3/uL (ref 145–400)
RBC: 4.26 10*6/uL (ref 4.20–5.70)
RDW: 14.4 % (ref 11.1–15.7)
WBC: 6 10*3/uL (ref 4.0–10.0)

## 2013-08-12 LAB — COMPREHENSIVE METABOLIC PANEL
ALBUMIN: 4.5 g/dL (ref 3.5–5.2)
ALK PHOS: 55 U/L (ref 39–117)
ALT: 26 U/L (ref 0–53)
AST: 14 U/L (ref 0–37)
BILIRUBIN TOTAL: 0.6 mg/dL (ref 0.2–1.2)
BUN: 17 mg/dL (ref 6–23)
CO2: 26 mEq/L (ref 19–32)
Calcium: 9.1 mg/dL (ref 8.4–10.5)
Chloride: 103 mEq/L (ref 96–112)
Creatinine, Ser: 1.05 mg/dL (ref 0.50–1.35)
Glucose, Bld: 115 mg/dL — ABNORMAL HIGH (ref 70–99)
POTASSIUM: 4.1 meq/L (ref 3.5–5.3)
Sodium: 137 mEq/L (ref 135–145)
TOTAL PROTEIN: 6.4 g/dL (ref 6.0–8.3)

## 2013-08-12 LAB — LACTATE DEHYDROGENASE: LDH: 189 U/L (ref 94–250)

## 2013-08-12 NOTE — Progress Notes (Signed)
Hematology and Oncology Follow Up Visit  Dwayne Wood [id number] Jan 15, 1966 48 y.o. 08/12/2013   Principle Diagnosis:   History of recurrent large cell non-Hodgkin's phonal-status post autologous transplant in 2010  Status post splenectomy  Current Therapy:    Observation     Interim History:  Mr.  Wood is back for followup. We see him every 6 was. He actually lives down the street from me.  Isn't done well. He's had no complaints has some numbness in his left foot. This has been present for quite a while. He thinks it may be a little worse right now.  I told her take a baby aspirin daily. Also, take vitamin B6 200 mg daily.  He's had no abnormal pain. He's been no change in bowel or bladder habits.  He is now 48 years old. He does have a regular doctor. We need to make her a referral for this.  He's had no rashes. He's had no headache. He's had no cough or shortness of breath.  Yesterday with a time without having any problems with infections.  Medications: Current outpatient prescriptions:cholecalciferol (VITAMIN D) 1000 UNITS tablet, Take 1,000 Units by mouth 2 (two) times daily., Disp: , Rfl: ;  naproxen sodium (ANAPROX) 220 MG tablet, Take 220 mg by mouth as needed., Disp: , Rfl: ;  cefdinir (OMNICEF) 300 MG capsule, Take 1 capsule (300 mg total) by mouth 2 (two) times daily., Disp: 14 capsule, Rfl: 3  Allergies:  Allergies  Allergen Reactions  . Allopurinol Rash    Past Medical History, Surgical history, Social history, and Family History were reviewed and updated.  Review of Systems: As above  Physical Exam:  height is 6\' 2"  (1.88 m) and weight is 219 lb (99.338 kg). His oral temperature is 98.1 F (36.7 C). His blood pressure is 110/60 and his pulse is 77. His respiration is 18.   Well-built and well-nourished white 7. His head and neck exam shows no ocular or oral lesion. He has no palpable cervical or supraclavicular this. Thyroid is non-palpable. Lungs  are clear bilaterally. Cardiac exam regular in rhythm with no murmurs rubs or bruits. Abdomen is soft. Has good bowel sounds. This likely scar is well-healed. There is no palpable liver as. Back exam no tenderness over the spine ribs or hips. Extremities shows no clubbing cyanosis or edema. As the right most of his joints. Has good pulses his distal extremities. Has good strength. Skin exam no rashes ecchymosis or petechia. Neurological exam is nonfocal.  Lab Results  Component Value Date   WBC 6.0 08/12/2013   HGB 14.9 08/12/2013   HCT 41.6 08/12/2013   MCV 98 08/12/2013   PLT 319 08/12/2013     Chemistry      Component Value Date/Time   NA 140 02/04/2013 1009   K 4.1 02/04/2013 1009   CL 102 02/04/2013 1009   CO2 27 02/04/2013 1009   BUN 19 02/04/2013 1009   CREATININE 1.15 02/04/2013 1009      Component Value Date/Time   CALCIUM 9.6 02/04/2013 1009   ALKPHOS 56 02/04/2013 1009   AST 15 02/04/2013 1009   ALT 38 02/04/2013 1009   BILITOT 0.6 02/04/2013 1009         Impression and Plan: Dwayne Wood is 48 year old done with a history of recurrent non-Hodgkin's lymphoma. This was a large cell lymphoma. He underwent transplant at [facility] back in 2010.  We will go ahead and plan to get him back now in  6 more months.  He definitely needs a a family doctor. I'll see if Dr. Valetta Fuller to see him.     Volanda Napoleon, MD 7/20/20152:26 PM

## 2013-08-13 ENCOUNTER — Telehealth: Payer: Self-pay | Admitting: *Deleted

## 2013-08-13 NOTE — Telephone Encounter (Addendum)
Message copied by Lenn Sink on Tue Aug 13, 2013  8:32 AM ------      Message from: Burney Gauze R      Created: Mon Aug 12, 2013  7:10 PM       Call - labs are ok!!  pete ------Left voicemail informing pt that labs are okay.

## 2014-02-12 ENCOUNTER — Ambulatory Visit (HOSPITAL_BASED_OUTPATIENT_CLINIC_OR_DEPARTMENT_OTHER): Payer: Managed Care, Other (non HMO) | Admitting: Family

## 2014-02-12 ENCOUNTER — Other Ambulatory Visit (HOSPITAL_BASED_OUTPATIENT_CLINIC_OR_DEPARTMENT_OTHER): Payer: Managed Care, Other (non HMO) | Admitting: Lab

## 2014-02-12 ENCOUNTER — Encounter: Payer: Self-pay | Admitting: Family

## 2014-02-12 DIAGNOSIS — C8510 Unspecified B-cell lymphoma, unspecified site: Secondary | ICD-10-CM

## 2014-02-12 DIAGNOSIS — E559 Vitamin D deficiency, unspecified: Secondary | ICD-10-CM

## 2014-02-12 DIAGNOSIS — C8580 Other specified types of non-Hodgkin lymphoma, unspecified site: Secondary | ICD-10-CM

## 2014-02-12 LAB — CBC WITH DIFFERENTIAL (CANCER CENTER ONLY)
BASO#: 0 10*3/uL (ref 0.0–0.2)
BASO%: 0.5 % (ref 0.0–2.0)
EOS%: 2.2 % (ref 0.0–7.0)
Eosinophils Absolute: 0.1 10*3/uL (ref 0.0–0.5)
HEMATOCRIT: 42.6 % (ref 38.7–49.9)
HGB: 14.8 g/dL (ref 13.0–17.1)
LYMPH#: 2.7 10*3/uL (ref 0.9–3.3)
LYMPH%: 43.5 % (ref 14.0–48.0)
MCH: 34.3 pg — AB (ref 28.0–33.4)
MCHC: 34.7 g/dL (ref 32.0–35.9)
MCV: 99 fL — AB (ref 82–98)
MONO#: 0.8 10*3/uL (ref 0.1–0.9)
MONO%: 13 % (ref 0.0–13.0)
NEUT%: 40.8 % (ref 40.0–80.0)
NEUTROS ABS: 2.6 10*3/uL (ref 1.5–6.5)
Platelets: 319 10*3/uL (ref 145–400)
RBC: 4.32 10*6/uL (ref 4.20–5.70)
RDW: 14.2 % (ref 11.1–15.7)
WBC: 6.3 10*3/uL (ref 4.0–10.0)

## 2014-02-12 NOTE — Progress Notes (Signed)
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 He is doing quite well. 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  He is also taking vitamin D and a Baby Aspirin daily.  No fatigue, fever, chills, n/v, cough, rash, headache, dizziness, SOB, chest pain, palpitations, abdominal pain, constipation, diarrhea, blood in urine or stool.  No swelling, tenderness, numbness or tingling in his extremities.  His appetite is good and he is staying hydrated. His weight is stable at 218 lbs. He changed insurances and is no longer followed by [facility]. We see him every 6 months.    CURRENT TREATMENT: Observation  REVIEW OF SYSTEMS: All other 10 point review of systems is negative.   PAST MEDICAL HISTORY: Past Medical History  Diagnosis Date  . Cancer   . 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  FAMILY HISTORY No family history on file.  GYNECOLOGIC HISTORY:  No LMP for male patient.   SOCIAL HISTORY: History   Social History  . Marital Status: Married    Spouse Name: N/A    Number of Children: N/A  . Years of Education: N/A   Occupational History  . Not on file.   Social History Main Topics  . Smoking status: Never Smoker   . Smokeless tobacco: Never Used     Comment: never used tobacco  . Alcohol Use: Not on file  . Drug Use: Not on file  . Sexual Activity: Not on file   Other Topics Concern  . Not on file   Social History Narrative    ADVANCED DIRECTIVES:  <no information>  HEALTH MAINTENANCE: History  Substance Use Topics  . Smoking status: Never Smoker   .  Smokeless tobacco: Never Used     Comment: never used tobacco  . Alcohol Use: Not on file   Colonoscopy: PAP: Bone density: Lipid panel:  Allergies  Allergen Reactions  . Allopurinol Rash    Current Outpatient Prescriptions  Medication Sig Dispense Refill  . cholecalciferol (VITAMIN D) 1000 UNITS tablet Take 1,000 Units by mouth 2 (two) times daily.    . naproxen sodium (ANAPROX) 220 MG tablet Take 220 mg by mouth as needed.    . pyridOXINE (VITAMIN B-6) 100 MG tablet Take 100 mg by mouth daily.    . cefdinir (OMNICEF) 300 MG capsule Take 1 capsule (300 mg total) by mouth 2 (two) times daily. (Patient not taking: Reported on 02/12/2014) 14 capsule 3   No current facility-administered medications for this visit.    OBJECTIVE: Filed Vitals:   02/12/14 1148  BP: 124/82  Pulse: 73  Temp: 98.1 F (36.7 C)  Resp: 18    Filed Weights   02/12/14 1148  Weight: 218 lb (98.884 kg)   ECOG FS:0 - Asymptomatic Ocular: Sclerae unicteric, pupils equal, round and reactive to light Ear-nose-throat: Oropharynx clear, dentition fair Lymphatic: No cervical or supraclavicular adenopathy Lungs no rales or rhonchi, good excursion bilaterally Heart regular rate and rhythm, no murmur appreciated Abd soft, nontender, positive bowel sounds MSK no focal spinal tenderness, no joint edema Neuro: non-focal, well-oriented, appropriate affect  LAB RESULTS: CMP     Component  Value Date/Time   NA 137 08/12/2013 1201   K 4.1 08/12/2013 1201   CL 103 08/12/2013 1201   CO2 26 08/12/2013 1201   GLUCOSE 115* 08/12/2013 1201   BUN 17 08/12/2013 1201   CREATININE 1.05 08/12/2013 1201   CALCIUM 9.1 08/12/2013 1201   PROT 6.4 08/12/2013 1201   ALBUMIN 4.5 08/12/2013 1201   AST 14 08/12/2013 1201   ALT 26 08/12/2013 1201   ALKPHOS 55 08/12/2013 1201   BILITOT 0.6 08/12/2013 1201   GFRNONAA >60 04/03/2008 0459   GFRAA  04/03/2008 0459    >60        The eGFR has been calculated using the MDRD  equation. This calculation has not been validated in all clinical situations. eGFR's persistently <60 mL/min signify possible Chronic Kidney Disease.   INo results found for: SPEP, UPEP Lab Results  Component Value Date   WBC 6.3 02/12/2014   NEUTROABS 2.6 02/12/2014   HGB 14.8 02/12/2014   HCT 42.6 02/12/2014   MCV 99* 02/12/2014   PLT 319 02/12/2014   No results found for: LABCA2 No components found for: ZBFMZ040 No results for input(s): INR in the last 168 hours.  STUDIES: No results found.  ASSESSMENT/PLAN: Dwayne Wood is 49 year old done with a history of recurrent non-Hodgkin's lymphoma. This was a large cell lymphoma. He underwent transplant at [facility] back in 2010. He is doing very well and is asymptomatic at this time.  We will see him back in 6 months for labs and follow-up.  He knows to call here with any questions or concerns and to go to the ED in the event of an emergency. We can certainly see him sooner if need be.   Eliezer Bottom, NP 02/12/2014 12:12 PM

## 2014-02-13 LAB — LACTATE DEHYDROGENASE: LDH: 172 U/L (ref 94–250)

## 2014-02-13 LAB — COMPREHENSIVE METABOLIC PANEL
ALBUMIN: 4.7 g/dL (ref 3.5–5.2)
ALT: 37 U/L (ref 0–53)
AST: 23 U/L (ref 0–37)
Alkaline Phosphatase: 57 U/L (ref 39–117)
BILIRUBIN TOTAL: 0.7 mg/dL (ref 0.2–1.2)
BUN: 16 mg/dL (ref 6–23)
CHLORIDE: 102 meq/L (ref 96–112)
CO2: 27 meq/L (ref 19–32)
Calcium: 9.5 mg/dL (ref 8.4–10.5)
Creatinine, Ser: 1.04 mg/dL (ref 0.50–1.35)
Glucose, Bld: 113 mg/dL — ABNORMAL HIGH (ref 70–99)
Potassium: 4.5 mEq/L (ref 3.5–5.3)
Sodium: 138 mEq/L (ref 135–145)
TOTAL PROTEIN: 6.8 g/dL (ref 6.0–8.3)

## 2014-02-13 LAB — VITAMIN D 25 HYDROXY (VIT D DEFICIENCY, FRACTURES): Vit D, 25-Hydroxy: 37 ng/mL (ref 30–100)

## 2014-02-17 ENCOUNTER — Telehealth: Payer: Self-pay | Admitting: *Deleted

## 2014-02-17 NOTE — Telephone Encounter (Signed)
-----   Message from Volanda Napoleon, MD sent at 02/16/2014 11:16 AM EST ----- Call - labs and vit D are ok!!  [place] our neighborhood!!!  Dwayne Wood

## 2014-08-13 ENCOUNTER — Other Ambulatory Visit (HOSPITAL_BASED_OUTPATIENT_CLINIC_OR_DEPARTMENT_OTHER): Payer: Managed Care, Other (non HMO)

## 2014-08-13 ENCOUNTER — Ambulatory Visit (HOSPITAL_BASED_OUTPATIENT_CLINIC_OR_DEPARTMENT_OTHER): Payer: Managed Care, Other (non HMO) | Admitting: Hematology & Oncology

## 2014-08-13 ENCOUNTER — Telehealth: Payer: Self-pay | Admitting: *Deleted

## 2014-08-13 VITALS — BP 115/71 | HR 68 | Temp 97.8°F | Resp 18 | Ht 72.0 in | Wt 220.0 lb

## 2014-08-13 DIAGNOSIS — C8580 Other specified types of non-Hodgkin lymphoma, unspecified site: Secondary | ICD-10-CM

## 2014-08-13 DIAGNOSIS — Z9081 Acquired absence of spleen: Secondary | ICD-10-CM

## 2014-08-13 DIAGNOSIS — Z8572 Personal history of non-Hodgkin lymphomas: Secondary | ICD-10-CM

## 2014-08-13 DIAGNOSIS — M818 Other osteoporosis without current pathological fracture: Secondary | ICD-10-CM

## 2014-08-13 DIAGNOSIS — T387X5A Adverse effect of androgens and anabolic congeners, initial encounter: Secondary | ICD-10-CM

## 2014-08-13 LAB — COMPREHENSIVE METABOLIC PANEL (CC13)
ALT: 40 U/L (ref 0–55)
AST: 28 U/L (ref 5–34)
Albumin: 4.4 g/dL (ref 3.5–5.0)
Alkaline Phosphatase: 65 U/L (ref 40–150)
Anion Gap: 7 meq/L (ref 3–11)
BUN: 20 mg/dL (ref 7.0–26.0)
CO2: 26 meq/L (ref 22–29)
Calcium: 9.3 mg/dL (ref 8.4–10.4)
Chloride: 107 meq/L (ref 98–109)
Creatinine: 1.1 mg/dL (ref 0.7–1.3)
EGFR: 80 ml/min/1.73 m2 — ABNORMAL LOW
Glucose: 99 mg/dL (ref 70–140)
Potassium: 4.4 meq/L (ref 3.5–5.1)
Sodium: 140 meq/L (ref 136–145)
Total Bilirubin: 0.56 mg/dL (ref 0.20–1.20)
Total Protein: 6.6 g/dL (ref 6.4–8.3)

## 2014-08-13 LAB — CBC WITH DIFFERENTIAL (CANCER CENTER ONLY)
BASO#: 0 10*3/uL (ref 0.0–0.2)
BASO%: 0.3 % (ref 0.0–2.0)
EOS ABS: 0.2 10*3/uL (ref 0.0–0.5)
EOS%: 2.6 % (ref 0.0–7.0)
HCT: 41.2 % (ref 38.7–49.9)
HGB: 14.5 g/dL (ref 13.0–17.1)
LYMPH#: 2.8 10*3/uL (ref 0.9–3.3)
LYMPH%: 45 % (ref 14.0–48.0)
MCH: 35 pg — ABNORMAL HIGH (ref 28.0–33.4)
MCHC: 35.2 g/dL (ref 32.0–35.9)
MCV: 100 fL — ABNORMAL HIGH (ref 82–98)
MONO#: 0.9 10*3/uL (ref 0.1–0.9)
MONO%: 15 % — ABNORMAL HIGH (ref 0.0–13.0)
NEUT#: 2.3 10*3/uL (ref 1.5–6.5)
NEUT%: 37.1 % — ABNORMAL LOW (ref 40.0–80.0)
PLATELETS: 275 10*3/uL (ref 145–400)
RBC: 4.14 10*6/uL — AB (ref 4.20–5.70)
RDW: 14.6 % (ref 11.1–15.7)
WBC: 6.1 10*3/uL (ref 4.0–10.0)

## 2014-08-13 NOTE — Telephone Encounter (Signed)
-----   Message from Volanda Napoleon, MD sent at 08/13/2014  4:49 PM EDT ----- Please call and let him know that the labs looked fantastic. Thanks

## 2014-08-13 NOTE — Progress Notes (Signed)
Hematology and Oncology Follow Up Visit  Dwayne Wood [id number] March 25, 1965 50 y.o. 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  Wood is back for followup. We see him every 6 months.. He actually lives down the street from me.  He feels well. Again, see him in the neighborhood every now then walking his dog.  He has not had a colonoscopy yet. He really needs 1 area and I will see about making a referral. He does not yet have a family doctor.  He's had no problems with infections. He's had no cough. He's had abdominal pain. He's had no change in bowel or bladder habits. He's had no legs 1. He's had no rashes.  I encouraged him to take baby aspirin daily.  There's been no headache. He's had no mouth sores. He's had no visual changes.  His overall performance status is ECOG 0.    Medications:  Current outpatient prescriptions:  .  cholecalciferol (VITAMIN D) 1000 UNITS tablet, Take 1,000 Units by mouth 2 (two) times daily., Disp: , Rfl:  .  naproxen sodium (ANAPROX) 220 MG tablet, Take 220 mg by mouth as needed., Disp: , Rfl:  .  pyridOXINE (VITAMIN B-6) 100 MG tablet, Take 100 mg by mouth daily., Disp: , Rfl:  .  cefdinir (OMNICEF) 300 MG capsule, Take 1 capsule (300 mg total) by mouth 2 (two) times daily. (Patient not taking: Reported on 02/12/2014), Disp: 14 capsule, Rfl: 3  Allergies:  Allergies  Allergen Reactions  . Allopurinol Rash    Past Medical History, Surgical history, Social history, and Family History were reviewed and updated.  Review of Systems: As above  Physical Exam:  height is 6' (1.829 m) and weight is 220 lb (99.791 kg). His oral temperature is 97.8 F (36.6 C). His blood pressure is 115/71 and his pulse is 68. His respiration is 18.   Well-built and well-nourished white  male. His head and neck exam shows no ocular or oral lesion. He has no palpable cervical or supraclavicular lymph nodes. Thyroid is non-palpable. Lungs are clear bilaterally. Cardiac exam regular rate and  rhythm with no murmurs rubs or bruits. Abdomen is soft. Has good bowel sounds. He has a laparotomy scar that is well-healed. There is no palpable liver edge.. Back exam shows no tenderness over the spine ribs or hips. Extremities shows no clubbing cyanosis or edema. He has good range of motion of his joints. Has good pulses his distal extremities. He has good strength. Skin exam shows no rashes ecchymosis or petechia. Neurological exam is nonfocal.  Lab Results  Component Value Date   WBC 6.1 08/13/2014   HGB 14.5 08/13/2014   HCT 41.2 08/13/2014   MCV 100* 08/13/2014   PLT 275 08/13/2014     Chemistry      Component Value Date/Time   NA 138 02/12/2014 1149   K 4.5 02/12/2014 1149   CL 102 02/12/2014 1149   CO2 27 02/12/2014 1149   BUN 16 02/12/2014 1149   CREATININE 1.04 02/12/2014 1149      Component Value Date/Time   CALCIUM 9.5 02/12/2014 1149   ALKPHOS 57 02/12/2014 1149   AST 23 02/12/2014 1149   ALT 37 02/12/2014 1149   BILITOT 0.7 02/12/2014 1149         Impression and Plan: Mr.  Wood is 49 year old done with a history of recurrent non-Hodgkin's lymphoma. This was a large cell lymphoma. He underwent transplant at [facility] back in 2010.  I do not see any evidence of recurrence.  Unfortunately, he still does not have a family doctor.  He needs a colonoscopy. I will have to set this up for him.  I think we can see him back in 8 months. I think this would make a lot of sense.    Volanda Napoleon, MD 7/20/201612:34 PM

## 2014-08-14 LAB — LACTATE DEHYDROGENASE: LDH: 211 U/L (ref 94–250)

## 2014-08-14 LAB — VITAMIN D 25 HYDROXY (VIT D DEFICIENCY, FRACTURES): Vit D, 25-Hydroxy: 41 ng/mL (ref 30–100)

## 2015-04-15 ENCOUNTER — Encounter: Payer: Self-pay | Admitting: Hematology & Oncology

## 2015-04-15 ENCOUNTER — Other Ambulatory Visit (HOSPITAL_BASED_OUTPATIENT_CLINIC_OR_DEPARTMENT_OTHER): Payer: Managed Care, Other (non HMO)

## 2015-04-15 ENCOUNTER — Ambulatory Visit (HOSPITAL_BASED_OUTPATIENT_CLINIC_OR_DEPARTMENT_OTHER): Payer: Managed Care, Other (non HMO) | Admitting: Hematology & Oncology

## 2015-04-15 VITALS — BP 127/76 | HR 58 | Temp 97.7°F | Resp 16 | Ht 72.0 in | Wt 220.0 lb

## 2015-04-15 DIAGNOSIS — Z8572 Personal history of non-Hodgkin lymphomas: Secondary | ICD-10-CM | POA: Diagnosis not present

## 2015-04-15 DIAGNOSIS — C8580 Other specified types of non-Hodgkin lymphoma, unspecified site: Secondary | ICD-10-CM

## 2015-04-15 DIAGNOSIS — M818 Other osteoporosis without current pathological fracture: Secondary | ICD-10-CM

## 2015-04-15 DIAGNOSIS — T387X5A Adverse effect of androgens and anabolic congeners, initial encounter: Secondary | ICD-10-CM

## 2015-04-15 DIAGNOSIS — E559 Vitamin D deficiency, unspecified: Secondary | ICD-10-CM

## 2015-04-15 LAB — CBC WITH DIFFERENTIAL (CANCER CENTER ONLY)
BASO#: 0 10*3/uL (ref 0.0–0.2)
BASO%: 0.6 % (ref 0.0–2.0)
EOS%: 3 % (ref 0.0–7.0)
Eosinophils Absolute: 0.2 10*3/uL (ref 0.0–0.5)
HCT: 43.2 % (ref 38.7–49.9)
HGB: 15.3 g/dL (ref 13.0–17.1)
LYMPH#: 3.1 10*3/uL (ref 0.9–3.3)
LYMPH%: 47.4 % (ref 14.0–48.0)
MCH: 34.7 pg — ABNORMAL HIGH (ref 28.0–33.4)
MCHC: 35.4 g/dL (ref 32.0–35.9)
MCV: 98 fL (ref 82–98)
MONO#: 1 10*3/uL — ABNORMAL HIGH (ref 0.1–0.9)
MONO%: 14.7 % — AB (ref 0.0–13.0)
NEUT#: 2.3 10*3/uL (ref 1.5–6.5)
NEUT%: 34.3 % — AB (ref 40.0–80.0)
PLATELETS: 295 10*3/uL (ref 145–400)
RBC: 4.41 10*6/uL (ref 4.20–5.70)
RDW: 13.9 % (ref 11.1–15.7)
WBC: 6.6 10*3/uL (ref 4.0–10.0)

## 2015-04-15 LAB — COMPREHENSIVE METABOLIC PANEL
ALBUMIN: 4.4 g/dL (ref 3.5–5.0)
ALK PHOS: 58 U/L (ref 40–150)
ALT: 31 U/L (ref 0–55)
ANION GAP: 7 meq/L (ref 3–11)
AST: 22 U/L (ref 5–34)
BUN: 15.3 mg/dL (ref 7.0–26.0)
CALCIUM: 9.2 mg/dL (ref 8.4–10.4)
CO2: 27 mEq/L (ref 22–29)
CREATININE: 1 mg/dL (ref 0.7–1.3)
Chloride: 106 mEq/L (ref 98–109)
EGFR: 86 mL/min/{1.73_m2} — ABNORMAL LOW (ref >90)
Glucose: 102 mg/dl (ref 70–140)
POTASSIUM: 4.5 meq/L (ref 3.5–5.1)
Sodium: 140 mEq/L (ref 136–145)
Total Bilirubin: 0.61 mg/dL (ref 0.20–1.20)
Total Protein: 7 g/dL (ref 6.4–8.3)

## 2015-04-15 LAB — LACTATE DEHYDROGENASE: LDH: 212 U/L (ref 125–245)

## 2015-04-15 NOTE — Progress Notes (Signed)
Hematology and Oncology Follow Up Visit  Dwayne Wood UX:[id number] [birth year] 50 y.o. 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  Wood is back for followup. He is doing quite well. Work is a little bit hectic for him area did  Thankfully, I see him in the neighborhood every now and then. He walks his dogs.  He was having some tingling in the right forearm. This may be from some nerve compression about the elbow. This started after he began to work out more. It is with his dominant arm. If there is continued problems, they probably will need to have this looked at by orthopedic surgery.  He's had no problems with fever. He's had no flu. He's had no infections.  He's had no cough or shortness of breath. He's had no rashes. He's had no leg swelling.  There's been no headache. He's had no mouth sores. He's had no visual changes.  His overall performance status is ECOG 0.    Medications:  Current outpatient prescriptions:  .  cefdinir (OMNICEF) 300 MG capsule, Take 1 capsule (300 mg total) by mouth 2 (two) times daily., Disp: 14 capsule, Rfl: 3 .  cholecalciferol (VITAMIN D) 1000 UNITS tablet, Take 1,000 Units by mouth 2 (two) times daily., Disp: , Rfl:  .  naproxen sodium (ANAPROX) 220 MG tablet, Take 220 mg by mouth as needed., Disp: , Rfl:  .  pyridOXINE (VITAMIN B-6) 100 MG tablet, Take 100 mg by mouth daily., Disp: , Rfl:   Allergies:  Allergies  Allergen Reactions  . Allopurinol Rash    Past Medical History, Surgical history, Social history, and Family History were reviewed and updated.  Review of Systems: As above  Physical Exam:  height is 6' (1.829 m) and weight is 220 lb (99.791 kg). His oral temperature is 97.7 F (36.5 C). His blood pressure is 127/76 and his pulse is 58. His respiration  is 16.   Well-built and well-nourished white male. His head and neck exam shows no ocular or oral lesion. He has no palpable cervical or supraclavicular lymph nodes. Thyroid is non-palpable. Lungs are clear bilaterally. Cardiac exam regular rate and  rhythm with no murmurs rubs or bruits. Abdomen is soft. Has good bowel sounds. He has a laparotomy scar that is well-healed. There is no palpable liver edge.. Back exam shows no tenderness over the spine ribs or hips. Extremities shows no clubbing cyanosis or edema. He has good range of motion of his joints. Has good pulses his distal extremities. He has good strength. Skin exam shows no rashes ecchymosis or petechia. Neurological exam is nonfocal.  Lab Results  Component Value Date   WBC 6.6 04/15/2015   HGB 15.3 04/15/2015   HCT 43.2 04/15/2015   MCV 98 04/15/2015   PLT 295 04/15/2015     Chemistry      Component Value Date/Time   NA 140 08/13/2014 1156   NA 138 02/12/2014 1149   K 4.4 08/13/2014 1156   K 4.5 02/12/2014 1149   CL 102 02/12/2014 1149   CO2 26 08/13/2014 1156   CO2 27 02/12/2014 1149   BUN 20.0 08/13/2014 1156   BUN 16 02/12/2014 1149   CREATININE 1.1 08/13/2014 1156   CREATININE 1.04 02/12/2014 1149      Component Value Date/Time   CALCIUM  9.3 08/13/2014 1156   CALCIUM 9.5 02/12/2014 1149   ALKPHOS 65 08/13/2014 1156   ALKPHOS 57 02/12/2014 1149   AST 28 08/13/2014 1156   AST 23 02/12/2014 1149   ALT 40 08/13/2014 1156   ALT 37 02/12/2014 1149   BILITOT 0.56 08/13/2014 1156   BILITOT 0.7 02/12/2014 1149         Impression and Plan: Dwayne Wood is 50 year old done with a history of recurrent non-Hodgkin's lymphoma. This was a large cell lymphoma. He underwent transplant at [facility] back in 2010.  I do not see any evidence of recurrence.  I think that we can make his appointments yearly now. He is 7 years out. I really don't think that there is going to be any problems with recurrent disease.  It  was really fun talk to him about his niece who will be a basketball superstar. She will go to [employer] after high school.   Dwayne Napoleon, MD 3/22/20171:19 PM

## 2015-04-16 LAB — VITAMIN D 25 HYDROXY (VIT D DEFICIENCY, FRACTURES): VIT D 25 HYDROXY: 42.3 ng/mL (ref 30.0–100.0)

## 2015-06-16 ENCOUNTER — Telehealth: Payer: Self-pay | Admitting: *Deleted

## 2015-06-16 MED ORDER — METHYLPREDNISOLONE 4 MG PO TBPK: ORAL_TABLET | ORAL | Status: DC

## 2015-06-16 NOTE — Telephone Encounter (Signed)
Patient c/o poison ivy rash to his thighs and buttocks. He states in the past Dr Marin Olp had prescribed him steroids. He has no PCP.  Spoke to Dr Marin Olp and we will send in Medrol Dose Pack. He can also use OTC steroid cream. Patient aware of new prescription.

## 2016-02-23 ENCOUNTER — Other Ambulatory Visit: Payer: Self-pay | Admitting: *Deleted

## 2016-02-23 DIAGNOSIS — C8580 Other specified types of non-Hodgkin lymphoma, unspecified site: Secondary | ICD-10-CM

## 2016-02-23 MED ORDER — CEFDINIR 300 MG PO CAPS: 300.0000 mg | ORAL_CAPSULE | Freq: Two times a day (BID) | ORAL | 3 refills | Status: AC

## 2016-04-12 ENCOUNTER — Ambulatory Visit: Payer: 59 | Admitting: Hematology & Oncology

## 2016-04-12 ENCOUNTER — Other Ambulatory Visit: Payer: 59

## 2017-05-17 ENCOUNTER — Other Ambulatory Visit: Payer: Self-pay

## 2017-05-17 ENCOUNTER — Inpatient Hospital Stay (HOSPITAL_BASED_OUTPATIENT_CLINIC_OR_DEPARTMENT_OTHER): Payer: 59 | Admitting: Hematology & Oncology

## 2017-05-17 ENCOUNTER — Inpatient Hospital Stay: Payer: 59 | Attending: Hematology & Oncology

## 2017-05-17 VITALS — BP 130/84 | HR 72 | Resp 17 | Wt 216.8 lb

## 2017-05-17 DIAGNOSIS — Z8572 Personal history of non-Hodgkin lymphomas: Secondary | ICD-10-CM | POA: Insufficient documentation

## 2017-05-17 DIAGNOSIS — C8580 Other specified types of non-Hodgkin lymphoma, unspecified site: Secondary | ICD-10-CM

## 2017-05-17 DIAGNOSIS — Z9484 Stem cells transplant status: Secondary | ICD-10-CM

## 2017-05-17 LAB — CBC WITH DIFFERENTIAL (CANCER CENTER ONLY)
Basophils Absolute: 0 10*3/uL (ref 0.0–0.1)
Basophils Relative: 0 %
EOS PCT: 2 %
Eosinophils Absolute: 0.1 10*3/uL (ref 0.0–0.5)
HEMATOCRIT: 41.6 % (ref 38.7–49.9)
Hemoglobin: 14.3 g/dL (ref 13.0–17.1)
LYMPHS PCT: 45 %
Lymphs Abs: 3.6 10*3/uL — ABNORMAL HIGH (ref 0.9–3.3)
MCH: 33.8 pg — AB (ref 28.0–33.4)
MCHC: 34.4 g/dL (ref 32.0–35.9)
MCV: 98.3 fL — AB (ref 82.0–98.0)
Monocytes Absolute: 1.3 10*3/uL — ABNORMAL HIGH (ref 0.1–0.9)
Monocytes Relative: 17 %
NEUTROS ABS: 2.8 10*3/uL (ref 1.5–6.5)
Neutrophils Relative %: 36 %
PLATELETS: 308 10*3/uL (ref 145–400)
RBC: 4.23 MIL/uL (ref 4.20–5.70)
RDW: 14.7 % (ref 11.1–15.7)
WBC: 7.9 10*3/uL (ref 4.0–10.0)

## 2017-05-17 LAB — CMP (CANCER CENTER ONLY)
ALT: 44 U/L (ref 10–47)
ANION GAP: 8 (ref 5–15)
AST: 34 U/L (ref 11–38)
Albumin: 4 g/dL (ref 3.5–5.0)
Alkaline Phosphatase: 76 U/L (ref 26–84)
BUN: 14 mg/dL (ref 7–22)
CHLORIDE: 107 mmol/L (ref 98–108)
CO2: 30 mmol/L (ref 18–33)
Calcium: 9.7 mg/dL (ref 8.0–10.3)
Creatinine: 1.3 mg/dL — ABNORMAL HIGH (ref 0.60–1.20)
Glucose, Bld: 101 mg/dL (ref 73–118)
POTASSIUM: 4 mmol/L (ref 3.3–4.7)
SODIUM: 145 mmol/L (ref 128–145)
Total Bilirubin: 0.9 mg/dL (ref 0.2–1.6)
Total Protein: 6.8 g/dL (ref 6.4–8.1)

## 2017-05-17 NOTE — Progress Notes (Signed)
Hematology and Oncology Follow Up Visit  Dwayne Wood [id number] [birth year] 52 y.o. 05/17/2017   Principle Diagnosis:   Recurrent large cell diffuse non-Hodgkin's lymphoma-status post autologous stem cell transplant in 2010  Status post splenectomy  Current Therapy:    Observation     Interim History:  Dwayne Wood is back for follow-up.  I last saw him about 2 years ago.  I need to see him yearly.  Thankfully, he lives a few houses down from a so if there had been any problems he will let me know.  He still does not have a family doctor.  I try to get him set up with 1.  I will try to get this done again.  I do not think he has had a colonoscopy.  Apparently his wife is worried about his decreased libido.  Again, this is some of that the family doctor would be dealing with.  He has had a little bit of a cough.  He apparently went to a urgent care center.  He was told that he had sinusitis and bronchitis.  He was given some antibiotics and steroids.    His appetite is good.  He is still working full-time.  Hopefully, he will be able to retire soon.  He has had no bleeding.  He has had no obvious change in bowel or bladder habits.  He has had no rashes.  There is been no leg swelling.  Overall, his performance status is ECOG 0.  Medications:  Current Outpatient Medications:  .  aspirin EC 81 MG tablet, Take 81 mg by mouth daily., Disp: , Rfl:  .  cefdinir (OMNICEF) 300 MG capsule, Take 1 capsule (300 mg total) by mouth 2 (two) times daily., Disp: 14 capsule, Rfl: 3 .  cholecalciferol (VITAMIN D) 1000 UNITS tablet, Take 1,000 Units by mouth 2 (two) times daily., Disp: , Rfl:  .  naproxen sodium (ANAPROX) 220 MG tablet, Take 220 mg by mouth as needed., Disp: , Rfl:  .  pyridOXINE (VITAMIN B-6) 100 MG tablet, Take 100 mg by mouth daily., Disp: , Rfl:   Allergies:  Allergies  Allergen Reactions  . Allopurinol Rash    Past Medical History, Surgical history, Social history,  and Family History were reviewed and updated.  Review of Systems: Review of Systems  Constitutional: Negative.   HENT:  Negative.   Eyes: Negative.   Respiratory: Negative.   Cardiovascular: Negative.   Gastrointestinal: Negative.   Endocrine: Negative.   Genitourinary: Negative.    Musculoskeletal: Negative.   Skin: Negative.   Neurological: Negative.   Hematological: Negative.   Psychiatric/Behavioral: Negative.     Physical Exam:  weight is 216 lb 12 oz (98.3 kg). His blood pressure is 130/84 and his pulse is 72. His respiration is 17 and oxygen saturation is 98%.   Wt Readings from Last 3 Encounters:  05/17/17 216 lb 12 oz (98.3 kg)  04/15/15 220 lb (99.8 kg)  08/13/14 220 lb (99.8 kg)    Physical Exam  Constitutional: He is oriented to person, place, and time.  HENT:  Head: Normocephalic and atraumatic.  Mouth/Throat: Oropharynx is clear and moist.  Eyes: Pupils are equal, round, and reactive to light. EOM are normal.  Neck: Normal range of motion.  Cardiovascular: Normal rate, regular rhythm and normal heart sounds.  Pulmonary/Chest: Effort normal and breath sounds normal.  Abdominal: Soft. Bowel sounds are normal.  Musculoskeletal: Normal range of motion. He exhibits no edema, tenderness or deformity.  Lymphadenopathy:    He has no cervical adenopathy.  Neurological: He is alert and oriented to person, place, and time.  Skin: Skin is warm and dry. No rash noted. No erythema.  Psychiatric: He has a normal mood and affect. His behavior is normal. Judgment and thought content normal.  Vitals reviewed.    Lab Results  Component Value Date   WBC 7.9 05/17/2017   HGB 14.3 05/17/2017   HCT 41.6 05/17/2017   MCV 98.3 (H) 05/17/2017   PLT 308 05/17/2017     Chemistry      Component Value Date/Time   NA 145 05/17/2017 1458   NA 140 04/15/2015 1150   K 4.0 05/17/2017 1458   K 4.5 04/15/2015 1150   CL 107 05/17/2017 1458   CO2 30 05/17/2017 1458   CO2 27  04/15/2015 1150   BUN 14 05/17/2017 1458   BUN 15.3 04/15/2015 1150   CREATININE 1.30 (H) 05/17/2017 1458   CREATININE 1.0 04/15/2015 1150      Component Value Date/Time   CALCIUM 9.7 05/17/2017 1458   CALCIUM 9.2 04/15/2015 1150   ALKPHOS 76 05/17/2017 1458   ALKPHOS 58 04/15/2015 1150   AST 34 05/17/2017 1458   AST 22 04/15/2015 1150   ALT 44 05/17/2017 1458   ALT 31 04/15/2015 1150   BILITOT 0.9 05/17/2017 1458   BILITOT 0.61 04/15/2015 1150         Impression and Plan: Dwayne Wood is a 52 year old white male.  He had recurrent diffuse large cell non-Hodgkin's lymphoma.  He ultimately underwent a autologous stem cell transplant at [facility] back in 2010.  There is been no evidence of recurrence.  I believe that he is cured.  He does not have a spleen.  I think that because of this, he does had to be watched.  He is at a high risk for infection.  He is at a high risk for developing acute leukemia.  I think we can still see him yearly.  He really, really needs to get a family doctor.  We will see if we can make this happen.  Again, since he lives close to me, if he has any problems he will voice can see me outside when I do yard work or M washing the car or walking the dog.   Volanda Napoleon, MD 4/24/20195:08 PM

## 2017-05-18 LAB — LACTATE DEHYDROGENASE: LDH: 235 U/L (ref 125–245)

## 2017-05-18 LAB — VITAMIN D 25 HYDROXY (VIT D DEFICIENCY, FRACTURES): Vit D, 25-Hydroxy: 56 ng/mL (ref 30.0–100.0)

## 2017-05-30 ENCOUNTER — Telehealth: Payer: Self-pay | Admitting: *Deleted

## 2017-05-30 DIAGNOSIS — C8580 Other specified types of non-Hodgkin lymphoma, unspecified site: Secondary | ICD-10-CM

## 2017-05-30 MED ORDER — FLUCONAZOLE 100 MG PO TABS: 100.0000 mg | ORAL_TABLET | Freq: Every day | ORAL | 0 refills | Status: AC

## 2017-05-30 NOTE — Telephone Encounter (Signed)
Patient is wanting to know which PCP Dr Marin Olp was suggesting he establish care with, and how to go about getting an appointment.   He has a rash behind his knee that travels up his thigh that he believes is a fungal infection. He needs this treated but currently is without a PCP.  Dr Marin Olp spoke to Dr Bevelyn Buckles. The patient is to call his office and request a new patient appointment, with DJ, Dr Buel Ream assistant. Patient given all information.  Dr Marin Olp will send in a prescription for Diflucan. Hopefully patient will be able to follow up with rash with the new PCP.

## 2018-12-24 ENCOUNTER — Other Ambulatory Visit: Payer: Self-pay

## 2018-12-24 DIAGNOSIS — Z20822 Contact with and (suspected) exposure to covid-19: Secondary | ICD-10-CM

## 2018-12-26 LAB — NOVEL CORONAVIRUS, NAA: SARS-CoV-2, NAA: NOT DETECTED

## 2019-11-29 DIAGNOSIS — Z0000 Encounter for general adult medical examination without abnormal findings: Secondary | ICD-10-CM | POA: Diagnosis not present

## 2019-11-29 DIAGNOSIS — Z79899 Other long term (current) drug therapy: Secondary | ICD-10-CM | POA: Diagnosis not present

## 2019-12-06 DIAGNOSIS — R030 Elevated blood-pressure reading, without diagnosis of hypertension: Secondary | ICD-10-CM | POA: Diagnosis not present

## 2019-12-06 DIAGNOSIS — Z0000 Encounter for general adult medical examination without abnormal findings: Secondary | ICD-10-CM | POA: Diagnosis not present

## 2020-04-28 DIAGNOSIS — M5416 Radiculopathy, lumbar region: Secondary | ICD-10-CM | POA: Diagnosis not present

## 2020-05-12 DIAGNOSIS — M5459 Other low back pain: Secondary | ICD-10-CM | POA: Diagnosis not present

## 2020-05-12 DIAGNOSIS — M5431 Sciatica, right side: Secondary | ICD-10-CM | POA: Diagnosis not present

## 2020-05-21 DIAGNOSIS — M5459 Other low back pain: Secondary | ICD-10-CM | POA: Diagnosis not present

## 2020-05-21 DIAGNOSIS — M5431 Sciatica, right side: Secondary | ICD-10-CM | POA: Diagnosis not present

## 2020-05-26 DIAGNOSIS — M5459 Other low back pain: Secondary | ICD-10-CM | POA: Diagnosis not present

## 2020-05-26 DIAGNOSIS — M5431 Sciatica, right side: Secondary | ICD-10-CM | POA: Diagnosis not present

## 2020-06-04 DIAGNOSIS — M5459 Other low back pain: Secondary | ICD-10-CM | POA: Diagnosis not present

## 2020-06-04 DIAGNOSIS — M5431 Sciatica, right side: Secondary | ICD-10-CM | POA: Diagnosis not present

## 2020-06-12 DIAGNOSIS — M9905 Segmental and somatic dysfunction of pelvic region: Secondary | ICD-10-CM | POA: Diagnosis not present

## 2020-06-12 DIAGNOSIS — M5430 Sciatica, unspecified side: Secondary | ICD-10-CM | POA: Diagnosis not present

## 2020-06-12 DIAGNOSIS — M4727 Other spondylosis with radiculopathy, lumbosacral region: Secondary | ICD-10-CM | POA: Diagnosis not present

## 2020-06-12 DIAGNOSIS — M5441 Lumbago with sciatica, right side: Secondary | ICD-10-CM | POA: Diagnosis not present

## 2020-06-12 DIAGNOSIS — M25551 Pain in right hip: Secondary | ICD-10-CM | POA: Diagnosis not present

## 2020-06-12 DIAGNOSIS — M533 Sacrococcygeal disorders, not elsewhere classified: Secondary | ICD-10-CM | POA: Diagnosis not present

## 2020-06-12 DIAGNOSIS — M9903 Segmental and somatic dysfunction of lumbar region: Secondary | ICD-10-CM | POA: Diagnosis not present

## 2020-06-12 DIAGNOSIS — M25552 Pain in left hip: Secondary | ICD-10-CM | POA: Diagnosis not present

## 2020-07-02 ENCOUNTER — Telehealth: Payer: Self-pay | Admitting: *Deleted

## 2020-07-02 NOTE — Telephone Encounter (Signed)
Returned pt call, ok to have MRI within [facility] system. Results can seen seen by MD.

## 2020-07-15 DIAGNOSIS — M5126 Other intervertebral disc displacement, lumbar region: Secondary | ICD-10-CM | POA: Diagnosis not present

## 2020-07-15 DIAGNOSIS — M47816 Spondylosis without myelopathy or radiculopathy, lumbar region: Secondary | ICD-10-CM | POA: Diagnosis not present

## 2020-07-15 DIAGNOSIS — M48061 Spinal stenosis, lumbar region without neurogenic claudication: Secondary | ICD-10-CM | POA: Diagnosis not present

## 2020-12-10 DIAGNOSIS — Z125 Encounter for screening for malignant neoplasm of prostate: Secondary | ICD-10-CM | POA: Diagnosis not present

## 2020-12-10 DIAGNOSIS — N529 Male erectile dysfunction, unspecified: Secondary | ICD-10-CM | POA: Diagnosis not present

## 2020-12-10 DIAGNOSIS — R7989 Other specified abnormal findings of blood chemistry: Secondary | ICD-10-CM | POA: Diagnosis not present

## 2020-12-10 DIAGNOSIS — Z79899 Other long term (current) drug therapy: Secondary | ICD-10-CM | POA: Diagnosis not present

## 2020-12-15 DIAGNOSIS — Z1212 Encounter for screening for malignant neoplasm of rectum: Secondary | ICD-10-CM | POA: Diagnosis not present

## 2020-12-15 DIAGNOSIS — Z23 Encounter for immunization: Secondary | ICD-10-CM | POA: Diagnosis not present

## 2020-12-15 DIAGNOSIS — Z0000 Encounter for general adult medical examination without abnormal findings: Secondary | ICD-10-CM | POA: Diagnosis not present

## 2020-12-15 DIAGNOSIS — M545 Low back pain, unspecified: Secondary | ICD-10-CM | POA: Diagnosis not present

## 2021-06-02 ENCOUNTER — Encounter: Payer: Self-pay | Admitting: Gastroenterology

## 2021-07-26 ENCOUNTER — Ambulatory Visit (AMBULATORY_SURGERY_CENTER): Payer: Self-pay | Admitting: *Deleted

## 2021-07-26 VITALS — Ht 72.0 in | Wt 211.4 lb

## 2021-07-26 DIAGNOSIS — Z1211 Encounter for screening for malignant neoplasm of colon: Secondary | ICD-10-CM

## 2021-07-26 MED ORDER — NA SULFATE-K SULFATE-MG SULF 17.5-3.13-1.6 GM/177ML PO SOLN: 1.0000 | Freq: Once | ORAL | 0 refills | Status: AC

## 2021-07-26 NOTE — Progress Notes (Signed)
No egg or soy allergy known to patient  No issues known to pt with past sedation with any surgeries or procedures Patient denies ever being told they had issues or difficulty with intubation  No FH of Malignant Hyperthermia Pt is not on diet pills Pt is not on  home 02  Pt is not on blood thinners  Pt denies issues with constipation  No A fib or A flutter Have any cardiac testing pending--  SUPREP Coupon to pt in PV today , Code to Pharmacy and  NO PA's for preps discussed with pt In PV today  Discussed with pt there will be an out-of-pocket cost for prep and that varies from $0 to 70 +  dollars - pt verbalized understanding  Pt instructed to use Singlecare.com or GoodRx for a price reduction on prep

## 2021-08-11 ENCOUNTER — Encounter: Payer: Self-pay | Admitting: Gastroenterology

## 2021-08-16 ENCOUNTER — Ambulatory Visit (AMBULATORY_SURGERY_CENTER): Payer: BC Managed Care – PPO | Admitting: Gastroenterology

## 2021-08-16 ENCOUNTER — Encounter: Payer: Self-pay | Admitting: Gastroenterology

## 2021-08-16 VITALS — BP 108/79 | HR 66 | Temp 98.4°F | Resp 16 | Ht 72.0 in | Wt 211.0 lb

## 2021-08-16 DIAGNOSIS — K635 Polyp of colon: Secondary | ICD-10-CM | POA: Diagnosis not present

## 2021-08-16 DIAGNOSIS — D120 Benign neoplasm of cecum: Secondary | ICD-10-CM

## 2021-08-16 DIAGNOSIS — D122 Benign neoplasm of ascending colon: Secondary | ICD-10-CM

## 2021-08-16 DIAGNOSIS — D123 Benign neoplasm of transverse colon: Secondary | ICD-10-CM | POA: Diagnosis not present

## 2021-08-16 DIAGNOSIS — K5140 Inflammatory polyps of colon without complications: Secondary | ICD-10-CM | POA: Diagnosis not present

## 2021-08-16 DIAGNOSIS — D126 Benign neoplasm of colon, unspecified: Secondary | ICD-10-CM | POA: Diagnosis not present

## 2021-08-16 DIAGNOSIS — Z1211 Encounter for screening for malignant neoplasm of colon: Secondary | ICD-10-CM

## 2021-08-16 DIAGNOSIS — D124 Benign neoplasm of descending colon: Secondary | ICD-10-CM

## 2021-08-16 MED ORDER — SODIUM CHLORIDE 0.9 % IV SOLN: 500.0000 mL | INTRAVENOUS | Status: DC

## 2021-08-16 NOTE — Progress Notes (Signed)
Pt's states no medical or surgical changes since previsit or office visit. 

## 2021-08-16 NOTE — Progress Notes (Signed)
Pt non-responsive, VVS, Report to RN  °

## 2021-08-16 NOTE — Progress Notes (Signed)
Called to room to assist during endoscopic procedure.  Patient ID and intended procedure confirmed with present staff. Received instructions for my participation in the procedure from the performing physician.  

## 2021-08-16 NOTE — Patient Instructions (Addendum)
Handout on polyps, diverticulosis, and hemorrhoids provided   Await pathology results.   Continue current medications.   YOU HAD AN ENDOSCOPIC PROCEDURE TODAY AT [facility]:   Refer to the procedure report that was given to you for any specific questions about what was found during the examination.  If the procedure report does not answer your questions, please call your gastroenterologist to clarify.  If you requested that your care partner not be given the details of your procedure findings, then the procedure report has been included in a sealed envelope for you to review at your convenience later.  YOU SHOULD EXPECT: Some feelings of bloating in the abdomen. Passage of more gas than usual.  Walking can help get rid of the air that was put into your GI tract during the procedure and reduce the bloating. If you had a lower endoscopy (such as a colonoscopy or flexible sigmoidoscopy) you may notice spotting of blood in your stool or on the toilet paper. If you underwent a bowel prep for your procedure, you may not have a normal bowel movement for a few days.  Please Note:  You might notice some irritation and congestion in your nose or some drainage.  This is from the oxygen used during your procedure.  There is no need for concern and it should clear up in a day or so.  SYMPTOMS TO REPORT IMMEDIATELY:  Following lower endoscopy (colonoscopy or flexible sigmoidoscopy):  Excessive amounts of blood in the stool  Significant tenderness or worsening of abdominal pains  Swelling of the abdomen that is new, acute  Fever of 100F or higher  For urgent or emergent issues, a gastroenterologist can be reached at any hour by calling [phone]. Do not use MyChart messaging for urgent concerns.    DIET:  We do recommend a small meal at first, but then you may proceed to your regular diet.  Drink plenty of fluids but you should avoid alcoholic beverages for 24 hours.  ACTIVITY:   You should plan to take it easy for the rest of today and you should NOT DRIVE or use heavy machinery until tomorrow (because of the sedation medicines used during the test).    FOLLOW UP: Our staff will call the number listed on your records the next business day following your procedure.  We will call around 7:15- 8:00 am to check on you and address any questions or concerns that you may have regarding the information given to you following your procedure. If we do not reach you, we will leave a message.  If you develop any symptoms (ie: fever, flu-like symptoms, shortness of breath, cough etc.) before then, please call [phone].  If you test positive for Covid 19 in the 2 weeks post procedure, please call and report this information to [place].    If any biopsies were taken you will be contacted by phone or by letter within the next 1-3 weeks.  Please call us at [phone] if you have not heard about the biopsies in 3 weeks.    SIGNATURES/CONFIDENTIALITY: You and/or your care partner have signed paperwork which will be entered into your electronic medical record.  These signatures attest to the fact that that the information above on your After Visit Summary has been reviewed and is understood.  Full responsibility of the confidentiality of this discharge information lies with you and/or your care-partner.

## 2021-08-16 NOTE — Op Note (Signed)
[facility] Patient Name: Dwayne Wood Procedure Date: 08/16/2021 9:58 AM MRN: [id number] Endoscopist: Remo Lipps P. Havery Moros , MD Age: 56 Referring MD:  Date of Birth: [birth year] Gender: Male Account #: [id number] Procedure:                Colonoscopy Indications:              Screening for colorectal malignant neoplasm, This                            is the patient's first colonoscopy Medicines:                Monitored Anesthesia Care Procedure:                Pre-Anesthesia Assessment:                           - Prior to the procedure, a History and Physical                            was performed, and patient medications and                            allergies were reviewed. The patient's tolerance of                            previous anesthesia was also reviewed. The risks                            and benefits of the procedure and the sedation                            options and risks were discussed with the patient.                            All questions were answered, and informed consent                            was obtained. Prior Anticoagulants: The patient has                            taken no previous anticoagulant or antiplatelet                            agents. ASA Grade Assessment: II - A patient with                            mild systemic disease. After reviewing the risks                            and benefits, the patient was deemed in                            satisfactory condition to undergo the procedure.  After obtaining informed consent, the colonoscope                            was passed under direct vision. Throughout the                            procedure, the patient's blood pressure, pulse, and                            oxygen saturations were monitored continuously. The                            CF [id number] #[id number] was introduced through the anus                            and advanced to the  the cecum, identified by                            appendiceal orifice and ileocecal valve. The                            colonoscopy was performed without difficulty. The                            patient tolerated the procedure well. The quality                            of the bowel preparation was good. The ileocecal                            valve, appendiceal orifice, and rectum were                            photographed. Scope In: 10:04:26 AM Scope Out: 10:25:41 AM Scope Withdrawal Time: 0 hours 18 minutes 17 seconds  Total Procedure Duration: 0 hours 21 minutes 15 seconds  Findings:                 The perianal and digital rectal examinations were                            normal.                           A diminutive polyp was found in the cecum. The                            polyp was sessile. The polyp was removed with a                            cold snare. Resection and retrieval were complete.                           Two flat and sessile polyps were found in the  ascending colon. The polyps were 2 to 4 mm in size.                            These polyps were removed with a cold snare.                            Resection and retrieval were complete.                           Three flat and sessile polyps were found in the                            transverse colon. The polyps were 3 to 4 mm in                            size. These polyps were removed with a cold snare.                            Resection and retrieval were complete.                           A 4 mm polyp was found in the descending colon. The                            polyp was sessile. The polyp was removed with a                            cold snare. Resection and retrieval were complete.                           Multiple small-mouthed diverticula were found in                            the sigmoid colon.                           Internal hemorrhoids were  found during                            retroflexion. The hemorrhoids were small.                           The exam was otherwise without abnormality. Complications:            No immediate complications. Estimated blood loss:                            Minimal. Estimated Blood Loss:     Estimated blood loss was minimal. Impression:               - One diminutive polyp in the cecum, removed with a                            cold snare. Resected and retrieved.                           -  Two 2 to 4 mm polyps in the ascending colon,                            removed with a cold snare. Resected and retrieved.                           - Three 3 to 4 mm polyps in the transverse colon,                            removed with a cold snare. Resected and retrieved.                           - One 4 mm polyp in the descending colon, removed                            with a cold snare. Resected and retrieved.                           - Diverticulosis in the sigmoid colon.                           - Internal hemorrhoids.                           - The examination was otherwise normal. Recommendation:           - Patient has a contact number available for                            emergencies. The signs and symptoms of potential                            delayed complications were discussed with the                            patient. Return to normal activities tomorrow.                            Written discharge instructions were provided to the                            patient.                           - Resume previous diet.                           - Continue present medications.                           - Await pathology results. Remo Lipps P. Trevar Boehringer, MD 08/16/2021 10:33:17 AM This report has been signed electronically.

## 2021-08-16 NOTE — Progress Notes (Signed)
[facility] History and Physical   Primary Care Physician:  Patient, No Pcp Per   Reason for Procedure:   Colon cancer screening  Plan:    colonoscopy     HPI: Dwayne Wood is a 56 y.o. male  here for colonoscopy screening - first time exam. Patient denies any bowel symptoms at this time. No family history of colon cancer known. Otherwise feels well without any cardiopulmonary symptoms. Have discussed risks / benefits and he wants to proceed.   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 Authorizing Provider  Cholecalciferol (VITAMIN D3 PO) Take by mouth daily. 1000 IU   Yes [provider]  methocarbamol (ROBAXIN) 500 MG tablet Take 500 mg by mouth every 6 (six) hours as needed. 07/30/21  Yes [provider]  Omega-3 Fatty Acids (FISH OIL OMEGA-3 PO) Take 2,400 mg by mouth daily. TAKE 2 CAPSULES   Yes [provider]  pyridOXINE (VITAMIN B-6) 100 MG tablet Take 100 mg by mouth daily.   Yes [provider]  aspirin EC 81 MG tablet Take 81 mg by mouth daily.    [provider]  cefdinir (OMNICEF) 300 MG capsule Take 1 capsule (300 mg total) by mouth 2 (two) times daily. Patient not taking: Reported on 07/26/2021 02/23/16   Volanda Napoleon, MD  fluconazole (DIFLUCAN) 100 MG tablet Take 1 tablet (100 mg total) by mouth daily. Patient not taking: Reported on 07/26/2021 05/30/17   Volanda Napoleon, MD  naproxen sodium (ANAPROX) 220 MG tablet Take 220 mg by mouth as needed. Patient not taking: Reported on 07/26/2021    [provider]    Current Outpatient Medications  Medication Sig Dispense Refill    Cholecalciferol (VITAMIN D3 PO) Take by mouth daily. 1000 IU     methocarbamol (ROBAXIN) 500 MG tablet Take 500 mg by mouth every 6 (six) hours as needed.     Omega-3 Fatty Acids (FISH OIL OMEGA-3 PO) Take 2,400 mg by mouth daily. TAKE 2 CAPSULES     pyridOXINE (VITAMIN B-6) 100 MG tablet Take 100 mg by mouth daily.     aspirin EC 81 MG tablet Take 81 mg by mouth daily.     cefdinir (OMNICEF) 300 MG capsule Take 1 capsule (300 mg total) by mouth 2 (two) times daily. (Patient not taking: Reported on 07/26/2021) 14 capsule 3   fluconazole (DIFLUCAN) 100 MG tablet Take 1 tablet (100 mg total) by mouth daily. (Patient not taking: Reported on 07/26/2021) 7 tablet 0   naproxen sodium (ANAPROX) 220 MG tablet Take 220 mg by mouth as needed. (Patient not taking: Reported on 07/26/2021)     Current Facility-Administered Medications  Medication Dose Route Frequency Provider Last Rate Last Admin   0.9 %  sodium chloride infusion  500 mL Intravenous Continuous Hektor Huston, Carlota Raspberry, MD        Allergies as of 08/16/2021 - Review Complete 08/16/2021  Allergen Reaction Noted   Allopurinol Rash 01/12/2011    Family History  Problem Relation Age of Onset   Colon polyps Father  Colitis Neg Hx    Crohn's disease Neg Hx    Esophageal cancer Neg Hx    Rectal cancer Neg Hx    Stomach cancer Neg Hx    Colon cancer Neg Hx     Social History   Socioeconomic History   Marital status: Married    Spouse name: Not on file   Number of children: Not on file   Years of education: Not on file   Highest education level: Not on file  Occupational History   Not on file  Tobacco Use   Smoking status: Never    Passive exposure: Never   Smokeless tobacco: Never   Tobacco comments:    never used tobacco  Vaping Use   Vaping Use: Never used  Substance and Sexual Activity   Alcohol use: Yes    Comment: 1 DAILY   Drug use: Never   Sexual activity: Not on file  Other Topics Concern   Not on file  Social History  Narrative   Not on file   Social Determinants of Health   Financial Resource Strain: Not on file  Food Insecurity: Not on file  Transportation Needs: Not on file  Physical Activity: Not on file  Stress: Not on file  Social Connections: Not on file  Intimate Partner Violence: Not on file    Review of Systems: All other review of systems negative except as mentioned in the HPI.  Physical Exam: Vital signs BP 106/68   Pulse 68   Temp 98.4 F (36.9 C)   Ht 6' (1.829 m)   Wt 211 lb (95.7 kg)   SpO2 97%   BMI 28.62 kg/m   General:   Alert,  Well-developed, pleasant and cooperative in NAD Lungs:  Clear throughout to auscultation.   Heart:  Regular rate and rhythm Abdomen:  Soft, nontender and nondistended.   Neuro/Psych:  Alert and cooperative. Normal mood and affect. A and O x 3  Jolly Mango, MD [facility]

## 2021-08-17 ENCOUNTER — Telehealth: Payer: Self-pay

## 2021-08-17 NOTE — Telephone Encounter (Signed)
Attempted f/u call. No answer, left VM. 

## 2022-02-07 DIAGNOSIS — R030 Elevated blood-pressure reading, without diagnosis of hypertension: Secondary | ICD-10-CM | POA: Diagnosis not present

## 2022-02-07 DIAGNOSIS — R7989 Other specified abnormal findings of blood chemistry: Secondary | ICD-10-CM | POA: Diagnosis not present

## 2022-02-15 DIAGNOSIS — Z0000 Encounter for general adult medical examination without abnormal findings: Secondary | ICD-10-CM | POA: Diagnosis not present

## 2022-02-15 DIAGNOSIS — R82998 Other abnormal findings in urine: Secondary | ICD-10-CM | POA: Diagnosis not present

## 2022-02-15 DIAGNOSIS — Z23 Encounter for immunization: Secondary | ICD-10-CM | POA: Diagnosis not present

## 2022-02-15 DIAGNOSIS — Z1331 Encounter for screening for depression: Secondary | ICD-10-CM | POA: Diagnosis not present

## 2022-02-15 DIAGNOSIS — R030 Elevated blood-pressure reading, without diagnosis of hypertension: Secondary | ICD-10-CM | POA: Diagnosis not present

## 2022-02-15 DIAGNOSIS — Z1339 Encounter for screening examination for other mental health and behavioral disorders: Secondary | ICD-10-CM | POA: Diagnosis not present

## 2023-03-06 DIAGNOSIS — Z79899 Other long term (current) drug therapy: Secondary | ICD-10-CM | POA: Diagnosis not present

## 2023-03-06 DIAGNOSIS — Z125 Encounter for screening for malignant neoplasm of prostate: Secondary | ICD-10-CM | POA: Diagnosis not present

## 2023-03-06 DIAGNOSIS — R7989 Other specified abnormal findings of blood chemistry: Secondary | ICD-10-CM | POA: Diagnosis not present

## 2023-03-06 DIAGNOSIS — Z1212 Encounter for screening for malignant neoplasm of rectum: Secondary | ICD-10-CM | POA: Diagnosis not present

## 2023-03-13 DIAGNOSIS — R82998 Other abnormal findings in urine: Secondary | ICD-10-CM | POA: Diagnosis not present

## 2023-03-13 DIAGNOSIS — Z0000 Encounter for general adult medical examination without abnormal findings: Secondary | ICD-10-CM | POA: Diagnosis not present

## 2023-03-13 DIAGNOSIS — Z23 Encounter for immunization: Secondary | ICD-10-CM | POA: Diagnosis not present

## 2023-06-29 ENCOUNTER — Encounter: Payer: Self-pay | Admitting: Neurology

## 2023-06-29 ENCOUNTER — Ambulatory Visit: Admitting: Neurology

## 2023-06-29 VITALS — BP 131/86 | HR 60 | Ht 75.0 in | Wt 213.0 lb

## 2023-06-29 DIAGNOSIS — R0683 Snoring: Secondary | ICD-10-CM

## 2023-06-29 DIAGNOSIS — C8580 Other specified types of non-Hodgkin lymphoma, unspecified site: Secondary | ICD-10-CM

## 2023-06-29 DIAGNOSIS — R0681 Apnea, not elsewhere classified: Secondary | ICD-10-CM

## 2023-06-29 NOTE — Patient Instructions (Signed)
 ASSESSMENT AND PLAN 58 y.o. year old male  here with:     1) Witnessed snoring and some pausing in his sleep breathing but no excessive sleepiness and not fatigued .     2) here to see if he has apnea or not, and if he has snoring alone would like a referral to  dental sleep specialists , he is not thrilled about the possibility of CPAP. He was educated about hypoxia, snrong an REM sleep and positional apnea components.    I plan to follow up either personally or through our NP within 3-4 months.    I would like to thank Patient, No Pcp Per and Bertha Broad, Md [address] [place],  [place] [address] for allowing me to meet with and to take care of this pleasant patient.        Living With Sleep Apnea Sleep apnea is a condition that affects your breathing while you're sleeping. Your tongue or the tissue in your throat may block the flow of air while you sleep. You may have shallow breathing or stop breathing for short periods of time. The breaks in breathing interrupt the deep sleep that you need to feel rested. Even if you don't wake up from the gaps in breathing, you may feel tired during the day. People with sleep apnea may snore loudly. You may have a headache in the morning and feel anxious or depressed. How can sleep apnea affect me? Sleep apnea increases your chances of being very tired during the day. This is called daytime fatigue. Sleep apnea can also increase your risk of: Heart attack. Stroke. Obesity. Type 2 diabetes. Heart failure. Irregular heartbeat. High blood pressure. If you are very tired during the day, you may be more likely to: Not do well in school or at work. Fall asleep while driving. Have trouble paying attention. Develop depression or anxiety. Have problems having sex. This is called sexual dysfunction. What actions can I take to manage sleep apnea? Sleep apnea treatment  If you were given a device to open your airway while you sleep, use it  only as told by your health care provider. You may be given: An oral appliance. This is a mouthpiece that shifts your lower jaw forward. A continuous positive airway pressure (CPAP) device. This blows air through a mask. A nasal expiratory positive airway pressure (EPAP) device. This has valves that you put into each nostril. A bi-level positive airway pressure (BIPAP) device. This blows air through a mask when you breathe in and breathe out. You may need surgery if other treatments don't work for you. Sleep habits Go to sleep and wake up at the same time every day. This helps set your internal clock for sleeping. If you stay up later than usual on weekends, try to get up in the morning within 2 hours of the time you usually wake up. Try to get at least 7-9 hours of sleep each night. Stop using a computer, tablet, and mobile phone a few hours before bedtime. Do not take long naps during the day. If you nap, limit it to 30 minutes. Have a relaxing bedtime routine. Reading or listening to music may relax you and help you sleep. Use your bedroom only for sleep. Keep your television and computer out of your bedroom. Keep your bedroom cool, dark, and quiet. Use a supportive mattress and pillows. Follow your provider's instructions for other changes to sleep habits. Nutrition Do not eat big meals in the evening.  Do not have caffeine in the later part of the day. The effects of caffeine can last for more than 5 hours. Follow your provider's instructions for any changes to what you eat and drink. Lifestyle Do not drink alcohol  before bedtime. Alcohol  can cause you to fall asleep at first, but then it can cause you to wake up in the middle of the night and have trouble getting back to sleep. Do not smoke, vape, or use nicotine or tobacco. Medicines Take over-the-counter and prescription medicines only as told by your provider. Do not use over-the-counter sleep medicine. You may become dependent on  this medicine, and it can make sleep apnea worse. Do not take medicines, such as sedatives and narcotics, unless told to by your provider. Activity Exercise on most days, but avoid exercising in the evening. Exercising near bedtime can interfere with sleeping. If possible, spend time outside every day. Natural light helps with your internal clock. General information Lose weight if you need to. Stay at a healthy weight. If you are having surgery, make sure to tell your provider that you have sleep apnea. You may need to bring your device with you. Keep all follow-up visits. Your provider will want to check on your condition. Where to find more information National Heart, Lung, and Blood Institute: [employer] This information is not intended to replace advice given to you by your health care provider. Make sure you discuss any questions you have with your health care provider. Document Revised: 05/04/2022 Document Reviewed: 05/04/2022 Elsevier Patient Education  2024 [employer].

## 2023-06-29 NOTE — Progress Notes (Signed)
 SLEEP MEDICINE CLINIC    Provider:  Neomia Banner, MD  Primary Care Physician:  Patient, No Pcp Per No address on file     Referring Provider: Bertha Broad, Md [address] [place],  [place] [address]          Chief Complaint according to patient   Patient presents with:     New Patient (Initial Visit)     his wife has informed him that he snores heavily. He has woken himself up snoring. No known apnea events. Get about 7-8 hrs of sleep a night. No trouble with falling or staying or sleep. No known family history of sleep disorders.  Worse in supine on the couch, and still present when on his side.       HISTORY OF PRESENT ILLNESS:  Dwayne Wood is a 58 y.o. male patient who is seen upon  PCP referral on 06/29/2023 from Dr Efraim Grange  for a sleep consult .  Chief concern according to patient :  " My wife is concerned"    I have the pleasure of seeing Dwayne Wood 06/29/23 a right-handed male with a possible sleep disorder.     Sleep relevant medical history: Nocturia  1-2,  snoring , no ENT surgery, spring allergies.  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 Non H- Lymphoma 2009, stem cell transplant 2010.  Never had Covid to his knowledge .  Right leg sciatica,     Family medical /sleep history: Father snored, brother snores- possibly other family member on CPAP with OSA.  Social history:  Patient is retired from [employer] ,   Counselling psychologist, and lives in a household with spouse,  daughter is adult - one Medical laboratory scientific officer and one dog.   Tobacco use; none.  ETOH use ; very rarely,  Caffeine intake in form of Coffee( 2 cups / AM only ) Soda( rare) Tea ( /) no energy drinks Exercise in form of  4 times a week gym exercise, 2 times daily walking. .     Sleep habits are as follows: The patient's dinner time is between 6 PM. The patient goes to bed at 12- 1 AM , on the phone or TV- sleeper setting -  and continues to sleep for 6-8 hours, wakes for  1-3 bathroom breaks, the first time at 2-3 AM.   The preferred sleep position is laterally, with the support of 1-2 pillows.  Flat bed.  Dreams are reportedly rare/ frequent/vivid.   The patient wakes up spontaneously ( or by cat alarm) , up by 8 AM is the usual rise time. He reports not feeling refreshed or restored in AM,  with symptoms such as dry mouth.  Naps are not taken. No need, no desire.    Review of Systems: Out of a complete 14 system review, the patient complains of only the following symptoms, and all other reviewed systems are negative.:  Fatigue, sleepiness , snoring,   How likely are you to doze in the following situations: 0 = not likely, 1 = slight chance, 2 = moderate chance, 3 = high chance   Sitting and Reading? Watching Television? 1 Sitting inactive in a public place (theater or meeting)? As a passenger in a car for an hour without a break? Lying down in the afternoon when circumstances permit? Sitting and talking to someone? Sitting quietly after lunch without alcohol ? In a car, while stopped for a few minutes in traffic?   Total =  1/ 24 points   FSS endorsed at 10/ 63 points.      Social History   Socioeconomic History   Marital status: Married    Spouse name: Not on file   Number of children: Not on file   Years of education: Not on file   Highest education level: Not on file  Occupational History   Not on file  Tobacco Use   Smoking status: Never    Passive exposure: Never   Smokeless tobacco: Never   Tobacco comments:    never used tobacco  Vaping Use   Vaping status: Never Used  Substance and Sexual Activity   Alcohol  use: Yes    Comment: 1 DAILY   Drug use: Never   Sexual activity: Not on file  Other Topics Concern   Not on file  Social History Narrative   Not on file   Social Drivers of Health   Financial Resource Strain: Not on file  Food Insecurity: Not on file  Transportation Needs: Not on file  Physical Activity: Not  on file  Stress: Not on file  Social Connections: Unknown (06/08/2021)   [facility], [facility]   Social Network    Social Network: Not on file    Family History  Problem Relation Age of Onset   Colon polyps Father    Colitis Neg Hx    Crohn's disease Neg Hx    Esophageal cancer Neg Hx    Rectal cancer Neg Hx    Stomach cancer Neg Hx    Colon cancer Neg Hx     Past Medical History:  Diagnosis Date   Cancer (HCC)    Lymphoma malignant, 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     aspirin EC 81 MG tablet Take 81 mg by mouth daily.     Cholecalciferol (VITAMIN D3 PO) Take by mouth daily. 1000 IU     naproxen sodium (ANAPROX) 220 MG tablet Take 220 mg by mouth as needed.     Omega-3 Fatty Acids (FISH OIL OMEGA-3 PO) Take 2,400 mg by mouth daily. TAKE 2 CAPSULES     pyridOXINE (VITAMIN B-6) 100 MG tablet Take 100 mg by mouth daily.     cefdinir  (OMNICEF ) 300 MG capsule Take 1 capsule (300 mg total) by mouth 2 (two) times daily. (Patient not taking: Reported on 06/29/2023) 14 capsule 3   fluconazole  (DIFLUCAN ) 100 MG tablet Take 1 tablet (100 mg total) by mouth daily. (Patient not taking: Reported on 06/29/2023) 7 tablet 0   methocarbamol (ROBAXIN) 500 MG tablet Take 500 mg by mouth every 6 (six) hours as needed. (Patient not taking: Reported on 06/29/2023)     No current facility-administered medications on file prior to visit.    Allergies  Allergen Reactions   Allopurinol Rash     DIAGNOSTIC DATA (LABS, IMAGING, TESTING) - I reviewed patient records, labs, notes, testing and imaging myself where available.  Lab Results  Component  Value Date   WBC 7.9 05/17/2017   HGB 14.3 05/17/2017   HCT 41.6  05/17/2017   MCV 98.3 (H) 05/17/2017   PLT 308 05/17/2017      Component Value Date/Time   NA 145 05/17/2017 1458   NA 140 04/15/2015 1150   K 4.0 05/17/2017 1458   K 4.5 04/15/2015 1150   CL 107 05/17/2017 1458   CO2 30 05/17/2017 1458   CO2 27 04/15/2015 1150   GLUCOSE 101 05/17/2017 1458   GLUCOSE 102 04/15/2015 1150   BUN 14 05/17/2017 1458   BUN 15.3 04/15/2015 1150   CREATININE 1.30 (H) 05/17/2017 1458   CREATININE 1.0 04/15/2015 1150   CALCIUM 9.7 05/17/2017 1458   CALCIUM 9.2 04/15/2015 1150   PROT 6.8 05/17/2017 1458   PROT 7.0 04/15/2015 1150   ALBUMIN 4.0 05/17/2017 1458   ALBUMIN 4.4 04/15/2015 1150   AST 34 05/17/2017 1458   AST 22 04/15/2015 1150   ALT 44 05/17/2017 1458   ALT 31 04/15/2015 1150   ALKPHOS 76 05/17/2017 1458   ALKPHOS 58 04/15/2015 1150   BILITOT 0.9 05/17/2017 1458   BILITOT 0.61 04/15/2015 1150   GFRNONAA >60 04/03/2008 0459   GFRAA  04/03/2008 0459    >60        The eGFR has been calculated using the MDRD equation. This calculation has not been validated in all clinical situations. eGFR's persistently <60 mL/min signify possible Chronic Kidney Disease.   No results found for: "CHOL", "HDL", "LDLCALC", "LDLDIRECT", "TRIG", "CHOLHDL" No results found for: "HGBA1C" No results found for: "VITAMINB12" No results found for: "TSH"  PHYSICAL EXAM:  Today's Vitals   06/29/23 1139  BP: 131/86  Pulse: 60  Weight: 213 lb (96.6 kg)  Height: 6\' 3"  (1.905 m)   Body mass index is 26.62 kg/m.   Wt Readings from Last 3 Encounters:  06/29/23 213 lb (96.6 kg)  08/16/21 211 lb (95.7 kg)  07/26/21 211 lb 6.4 oz (95.9 kg)     Ht Readings from Last 3 Encounters:  06/29/23 6\' 3"  (1.905 m)  08/16/21 6' (1.829 m)  07/26/21 6' (1.829 m)      General: The patient is awake, alert and appears not in acute distress. The patient is well groomed. Head: Normocephalic,  atraumatic. Neck is supple.  Mallampati , 2 neck circumference:17 inches . Nasal airflow patent.  Retrognathia is not seen.  Overbite is slight.  Dental status:  biological Cardiovascular:  Regular rate and cardiac rhythm by pulse,  without distended neck veins. Respiratory: Lungs are clear to auscultation.  Skin:  Without evidence of ankle edema, or rash. Trunk: The patient's posture is erect.   NEUROLOGIC EXAM: The patient is awake and alert, oriented to place and time.   Memory subjective described as intact.  Attention span & concentration ability appears normal.  Speech is fluent,  without dysarthria, dysphonia or aphasia.  Mood and affect are appropriate.   Cranial nerves: no loss of smell or taste reported  Pupils are equal and briskly reactive to light. Funduscopic exam deferred.  Extraocular movements in vertical and horizontal planes were intact and without nystagmus. No Diplopia. Visual fields by finger perimetry are intact. Hearing was intact to soft voice and finger rubbing.    Facial sensation intact to fine touch.  Facial motor strength is symmetric and tongue and uvula move midline.  Neck ROM : rotation, tilt and flexion extension were normal for age and shoulder shrug was symmetrical.    Motor exam:  Symmetric bulk, tone and ROM.   Normal tone without cog wheeling, symmetric grip  strength . Misses index- finger on the right hand.    Sensory:  Fine touch and vibration were normal.  Proprioception tested in the upper extremities was normal.   Coordination: Rapid alternating movements in the fingers/hands were of normal speed.  The Finger-to-nose maneuver was intact without evidence of ataxia, dysmetria or tremor.   Gait and station: Patient could rise unassisted from a seated position, walked without assistive device.  Stance is of normal width/ base, Toe and heel walk were deferred.  Deep tendon reflexes: in the  upper and lower extremities are symmetric and  intact.  Babinski response was deferred .     ASSESSMENT AND PLAN 58 y.o. year old male  here with:    1) Witnessed snoring and some pausing in his sleep breathing but no excessive sleepiness and not fatigued .   2) here to see if he has apnea or not, and if he has snoring alone would like a referral to  dental sleep specialists , he is not thrilled about the possibility of CPAP. He was educated about hypoxia, snrong an REM sleep and positional apnea components.   I plan to follow up either personally or through our NP within 3-4 months.   I would like to thank Patient, No Pcp Per and Bertha Broad, Md [address] [place],  [place] [address] for allowing me to meet with and to take care of this pleasant patient.     After spending a total time of  35  minutes face to face and additional time for physical and neurologic examination, review of laboratory studies,  personal review of imaging studies, reports and results of other testing and review of referral information / records as far as provided in visit,   Electronically signed by: Neomia Banner, MD 06/29/2023 12:10 PM  [facility] and [facility] Board certified by The [employer] of Sleep Medicine and Diplomate of the [employer] of Sleep Medicine. Board certified In Neurology through the ABPN, Fellow of the [employer] of Neurology.

## 2023-07-05 ENCOUNTER — Ambulatory Visit: Admitting: Neurology

## 2023-07-05 DIAGNOSIS — R0681 Apnea, not elsewhere classified: Secondary | ICD-10-CM

## 2023-07-05 DIAGNOSIS — G4733 Obstructive sleep apnea (adult) (pediatric): Secondary | ICD-10-CM

## 2023-07-05 DIAGNOSIS — C8580 Other specified types of non-Hodgkin lymphoma, unspecified site: Secondary | ICD-10-CM

## 2023-07-05 DIAGNOSIS — R0683 Snoring: Secondary | ICD-10-CM

## 2023-07-19 NOTE — Progress Notes (Signed)
 [facility]  Dwayne Wood 58 year old male November 15, 1965   HOME SLEEP TEST REPORT ( by Watch PAT)   This device was  mail-ordered and the Data loaded on 07-20-2023   STUDY DATE:  07-06-2023 ( day of posting)      ORDERING CLINICIAN: Dedra Gores, MD  REFERRING CLINICIAN: Dr Paterson/ Dr Shayne    CLINICAL INFORMATION/HISTORY: 06/29/2023- referral from Dr Yolande  for a sleep consult .  Chief concern according to patient :   My  wife witnessed an apnea . Mhx: patient with malignant lymphoma , back pain , Nocturia  1-2,  snoring , no ENT surgery, spring allergies.  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 Non H- Lymphoma 2009, stem cell transplant 2010.  Never had Covid (to his knowledge) .  Witnessed snoring and some pausing in his sleep breathing but no excessive sleepiness and not feeling fatigued .    Family medical /sleep history: Father snored, brother snores- possibly other family member on CPAP with OSA. Would like a dental device if found to have OSA    Epworth sleepiness score: 1/ 24 points . FSS endorsed at 10/ 63 points.    BMI: 26.6 kg/m   Neck Circumference: 17   FINDINGS:   Sleep Summary:   Total Recording Time (hours, min):    9 hours 23 minutes    Total Sleep Time (hours, min):      7 hours 15 minutes           Percent REM (%): 22.4%                                      Respiratory Indices:   Calculated pAHI (per AASM guideline): 16.5/h                         REM pAHI:   27.7/h                                              NREM pAHI: 13.2/h                            Positional AHI: The patient slept 265 minutes in nonsupine position divided between left and right as well as prone sleep.  There were 170 minutes in supine position the AHI here was 25.2, the AHI in prone sleep was 0, the AHI on his left side was 1.3/h and the AHI when sleeping on his right was 20/h.  Snoring statistics show a mean volume of only  40 dB this is the threshold of detection for this device.  Snoring -if present- would have been present for only about 7.5% of the total night.                                              Oxygen Saturation Statistics:   Oxygen Saturation (%) Mean:     93%           O2 Saturation Range (%):   Between a nadir at 89%  with a maximal saturation of 99%                                    O2 Saturation (minutes) <89%:    0 minutes       Pulse Rate Statistics:   Pulse Mean (bpm):     61 bpm            Pulse Range: Between a minimum heart rate of 48 and a maximum of 93 bpm.               IMPRESSION:  This HST confirms the presence of mild to moderate obstructive sleep apnea with REM sleep accentuation.  There were no central apneas seen, there was no associated hypoxia a trend to bradycardia was noted and progressed throughout the night with the lowest heart rates in the morning. There was a clear positional component noted with supine sleep versus nonsupine sleep.    Sleep on the left and sleep in prone position was associated with negligible apnea and I would recommend to resume the sleep positions.   RECOMMENDATION: Mild to moderate all obstructive sleep apnea with the only association of REM sleep accentuation and a positional component but not associated with hypoxia, bradycardia, not even associated with snoring.  I definitely encouraged to avoid supine sleep and in spite of the higher REM sleep index I would think that this patient could successfully try a dental device.  It also facilitates nonsupine sleep to sleep with a dental device versus a CPAP interface.  I see no critical Physiologic stressors arising from sleep apnea in this home sleep test, and since the patient expressed a desire to avoid CPAP I would definitely offer the dental device in its place.    INTERPRETING PHYSICIAN:   Dedra Gores, MD  [facility] and [facility] Sleep Board certified by The  [employer] of Sleep Medicine and Diplomate of the [employer] of Sleep Medicine. Board certified In Neurology through the ABPN, Fellow of the [employer] Neu[employer].

## 2023-08-03 ENCOUNTER — Ambulatory Visit: Payer: Self-pay | Admitting: Neurology

## 2023-08-03 DIAGNOSIS — R0681 Apnea, not elsewhere classified: Secondary | ICD-10-CM

## 2023-08-03 DIAGNOSIS — G4733 Obstructive sleep apnea (adult) (pediatric): Secondary | ICD-10-CM

## 2023-08-03 NOTE — Procedures (Signed)
 [facility]  Dwayne Wood 58 year old male November 15, 1965   HOME SLEEP TEST REPORT ( by Watch PAT)   This device was  mail-ordered and the Data loaded on 07-20-2023   STUDY DATE:  07-06-2023 ( day of posting)      ORDERING CLINICIAN: Dedra Gores, MD  REFERRING CLINICIAN: Dr Paterson/ Dr Shayne    CLINICAL INFORMATION/HISTORY: 06/29/2023- referral from Dr Yolande  for a sleep consult .  Chief concern according to patient :   My  wife witnessed an apnea . Mhx: patient with malignant lymphoma , back pain , Nocturia  1-2,  snoring , no ENT surgery, spring allergies.  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 Non H- Lymphoma 2009, stem cell transplant 2010.  Never had Covid (to his knowledge) .  Witnessed snoring and some pausing in his sleep breathing but no excessive sleepiness and not feeling fatigued .    Family medical /sleep history: Father snored, brother snores- possibly other family member on CPAP with OSA. Would like a dental device if found to have OSA    Epworth sleepiness score: 1/ 24 points . FSS endorsed at 10/ 63 points.    BMI: 26.6 kg/m   Neck Circumference: 17   FINDINGS:   Sleep Summary:   Total Recording Time (hours, min):    9 hours 23 minutes    Total Sleep Time (hours, min):      7 hours 15 minutes           Percent REM (%): 22.4%                                      Respiratory Indices:   Calculated pAHI (per AASM guideline): 16.5/h                         REM pAHI:   27.7/h                                              NREM pAHI: 13.2/h                            Positional AHI: The patient slept 265 minutes in nonsupine position divided between left and right as well as prone sleep.  There were 170 minutes in supine position the AHI here was 25.2, the AHI in prone sleep was 0, the AHI on his left side was 1.3/h and the AHI when sleeping on his right was 20/h.  Snoring statistics show a mean volume of only  40 dB this is the threshold of detection for this device.  Snoring -if present- would have been present for only about 7.5% of the total night.                                              Oxygen Saturation Statistics:   Oxygen Saturation (%) Mean:     93%           O2 Saturation Range (%):   Between a nadir at 89%  with a maximal saturation of 99%                                    O2 Saturation (minutes) <89%:    0 minutes       Pulse Rate Statistics:   Pulse Mean (bpm):     61 bpm            Pulse Range: Between a minimum heart rate of 48 and a maximum of 93 bpm.               IMPRESSION:  This HST confirms the presence of mild to moderate obstructive sleep apnea with REM sleep accentuation.  There were no central apneas seen, there was no associated hypoxia a trend to bradycardia was noted and progressed throughout the night with the lowest heart rates in the morning. There was a clear positional component noted with supine sleep versus nonsupine sleep.    Sleep on the left and sleep in prone position was associated with negligible apnea and I would recommend to resume the sleep positions.   RECOMMENDATION: Mild to moderate all obstructive sleep apnea with the only association of REM sleep accentuation and a positional component but not associated with hypoxia, bradycardia, not even associated with snoring.  I definitely encouraged to avoid supine sleep and in spite of the higher REM sleep index I would think that this patient could successfully try a dental device.  It also facilitates nonsupine sleep to sleep with a dental device versus a CPAP interface.  I see no critical Physiologic stressors arising from sleep apnea in this home sleep test, and since the patient expressed a desire to avoid CPAP I would definitely offer the dental device in its place.    INTERPRETING PHYSICIAN:   Dedra Gores, MD  [facility] and [facility] Sleep Board certified by The  [employer] of Sleep Medicine and Diplomate of the [employer] of Sleep Medicine. Board certified In Neurology through the ABPN, Fellow of the [employer] of Neu[employer].

## 2023-08-07 ENCOUNTER — Telehealth: Payer: Self-pay | Admitting: Neurology

## 2023-08-07 NOTE — Telephone Encounter (Signed)
 Referral to Dentistry faxed to [facility]  Phone: [phone] Fax: [phone]

## 2023-08-07 NOTE — Telephone Encounter (Signed)
Called patient to discuss sleep study results. No answer at this time. LVM for the patient to call back.  Will send a mychart as well

## 2023-08-07 NOTE — Telephone Encounter (Signed)
-----   Message from Franklin Dohmeier sent at 08/03/2023  4:55 PM EDT -----    Mild to moderate all-obstructive sleep apnea with the only association of REM sleep accentuation and a positional component   but not associated with hypoxia, bradycardia, not even associated with snoring.   I definitely encourage this gentleman to avoid supine sleep . In spite of the higher REM than NREM sleep apnea index,  I  think that this patient could successfully try a dental device.   It also facilitates nonsupine sleep to sleep with a dental device versus a CPAP interface.    I see no critical Physiologic stressors arising from sleep apnea in this home sleep test, and since the patient expressed a desire to avoid CPAP I would definitely offer the dental device in its place.  PS ; Staff; If a referral to sleep dentist is wanted, I have already placed a referral  ----- Message ----- From: Chalice Saunas, MD Sent: 08/03/2023   4:50 PM EDT To: Saunas Chalice, MD

## 2023-08-30 NOTE — Telephone Encounter (Signed)
 Called the patient and was able to review the information from the sleep study in detail with the patient. Pt would prefer the dental device route. Advised a referral was sent middle of July for the patient. Advised someone from [facility] should be contacting him. If no one reaches out within a week informed him to call us  back. I will have referrals check on this as well.  Pt verbalized understanding. Pt had no questions at this time but was encouraged to call back if questions arise.

## 2023-08-31 NOTE — Telephone Encounter (Signed)
 Called  [facility] to follow up on Pt Referral ,. Referral Coordinator  informed that they have Pt Referral and have  reached out to Pt . Pt states to them that he will call back to schedule .  Called Pt , No answer , LVM  for Pt to call back office

## 2023-08-31 NOTE — Telephone Encounter (Signed)
 Contacted Dr. Carmin office receptionist stated have tried to contact him and he said would call back to schedule appointment. Will reach out to him to try to schedule the appointment.
